# Patient Record
Sex: Female | Born: 1950 | Race: White | Hispanic: No | Marital: Married | Smoking: Former smoker
Health system: Southern US, Community
[De-identification: ages and names within clinical notes are randomized; demographics above are authoritative.]

## PROBLEM LIST (undated history)

## (undated) DIAGNOSIS — K469 Unspecified abdominal hernia without obstruction or gangrene: Secondary | ICD-10-CM

## (undated) DIAGNOSIS — I10 Essential (primary) hypertension: Secondary | ICD-10-CM

## (undated) DIAGNOSIS — E785 Hyperlipidemia, unspecified: Secondary | ICD-10-CM

## (undated) DIAGNOSIS — Z5189 Encounter for other specified aftercare: Secondary | ICD-10-CM

## (undated) DIAGNOSIS — K219 Gastro-esophageal reflux disease without esophagitis: Secondary | ICD-10-CM

## (undated) DIAGNOSIS — Z86010 Personal history of colonic polyps: Secondary | ICD-10-CM

## (undated) DIAGNOSIS — D171 Benign lipomatous neoplasm of skin and subcutaneous tissue of trunk: Secondary | ICD-10-CM

## (undated) DIAGNOSIS — K5732 Diverticulitis of large intestine without perforation or abscess without bleeding: Secondary | ICD-10-CM

## (undated) DIAGNOSIS — G709 Myoneural disorder, unspecified: Secondary | ICD-10-CM

## (undated) HISTORY — DX: Encounter for other specified aftercare: Z51.89

## (undated) HISTORY — DX: Diverticulitis of large intestine without perforation or abscess without bleeding: K57.32

## (undated) HISTORY — DX: Personal history of colonic polyps: Z86.010

## (undated) HISTORY — DX: Unspecified abdominal hernia without obstruction or gangrene: K46.9

## (undated) HISTORY — DX: Benign lipomatous neoplasm of skin and subcutaneous tissue of trunk: D17.1

## (undated) HISTORY — DX: Myoneural disorder, unspecified: G70.9

## (undated) HISTORY — DX: Hyperlipidemia, unspecified: E78.5

## (undated) HISTORY — DX: Gastro-esophageal reflux disease without esophagitis: K21.9

---

## 1974-11-18 HISTORY — PX: OTHER SURGICAL HISTORY: SHX169

## 1983-11-19 HISTORY — PX: OTHER SURGICAL HISTORY: SHX169

## 1986-11-18 HISTORY — PX: BREAST REDUCTION SURGERY: SHX8

## 1998-04-10 ENCOUNTER — Encounter: Admission: RE | Admit: 1998-04-10 | Discharge: 1998-04-10 | Payer: Self-pay | Admitting: Family Medicine

## 1998-07-28 ENCOUNTER — Encounter: Admission: RE | Admit: 1998-07-28 | Discharge: 1998-07-28 | Payer: Self-pay | Admitting: Family Medicine

## 1998-07-28 ENCOUNTER — Other Ambulatory Visit: Admission: RE | Admit: 1998-07-28 | Discharge: 1998-07-28 | Payer: Self-pay | Admitting: Family Medicine

## 1998-08-11 ENCOUNTER — Encounter: Admission: RE | Admit: 1998-08-11 | Discharge: 1998-08-11 | Payer: Self-pay | Admitting: Family Medicine

## 1998-08-18 ENCOUNTER — Encounter: Admission: RE | Admit: 1998-08-18 | Discharge: 1998-08-18 | Payer: Self-pay | Admitting: Family Medicine

## 1998-08-25 ENCOUNTER — Encounter: Admission: RE | Admit: 1998-08-25 | Discharge: 1998-08-25 | Payer: Self-pay | Admitting: Family Medicine

## 1998-09-25 ENCOUNTER — Encounter: Admission: RE | Admit: 1998-09-25 | Discharge: 1998-09-25 | Payer: Self-pay | Admitting: Family Medicine

## 1999-04-30 ENCOUNTER — Encounter: Admission: RE | Admit: 1999-04-30 | Discharge: 1999-04-30 | Payer: Self-pay | Admitting: Family Medicine

## 1999-08-22 ENCOUNTER — Encounter: Admission: RE | Admit: 1999-08-22 | Discharge: 1999-08-22 | Payer: Self-pay | Admitting: Family Medicine

## 2000-05-19 ENCOUNTER — Encounter: Admission: RE | Admit: 2000-05-19 | Discharge: 2000-05-19 | Payer: Self-pay | Admitting: Family Medicine

## 2000-06-09 ENCOUNTER — Encounter: Admission: RE | Admit: 2000-06-09 | Discharge: 2000-06-09 | Payer: Self-pay | Admitting: Family Medicine

## 2001-04-21 ENCOUNTER — Encounter: Admission: RE | Admit: 2001-04-21 | Discharge: 2001-04-21 | Payer: Self-pay | Admitting: Family Medicine

## 2001-10-26 ENCOUNTER — Encounter: Admission: RE | Admit: 2001-10-26 | Discharge: 2001-10-26 | Payer: Self-pay | Admitting: Family Medicine

## 2002-04-16 ENCOUNTER — Ambulatory Visit (HOSPITAL_COMMUNITY): Admission: RE | Admit: 2002-04-16 | Discharge: 2002-04-16 | Payer: Self-pay | Admitting: Family Medicine

## 2002-04-16 ENCOUNTER — Encounter: Admission: RE | Admit: 2002-04-16 | Discharge: 2002-04-16 | Payer: Self-pay | Admitting: Family Medicine

## 2002-05-07 ENCOUNTER — Encounter: Admission: RE | Admit: 2002-05-07 | Discharge: 2002-05-07 | Payer: Self-pay | Admitting: Family Medicine

## 2002-05-07 ENCOUNTER — Encounter (INDEPENDENT_AMBULATORY_CARE_PROVIDER_SITE_OTHER): Payer: Self-pay | Admitting: *Deleted

## 2002-05-20 ENCOUNTER — Encounter: Admission: RE | Admit: 2002-05-20 | Discharge: 2002-05-20 | Payer: Self-pay | Admitting: Family Medicine

## 2002-06-05 ENCOUNTER — Emergency Department (HOSPITAL_COMMUNITY): Admission: EM | Admit: 2002-06-05 | Discharge: 2002-06-05 | Payer: Self-pay | Admitting: Internal Medicine

## 2002-06-21 ENCOUNTER — Encounter: Admission: RE | Admit: 2002-06-21 | Discharge: 2002-06-21 | Payer: Self-pay | Admitting: Family Medicine

## 2002-09-27 ENCOUNTER — Encounter: Admission: RE | Admit: 2002-09-27 | Discharge: 2002-09-27 | Payer: Self-pay | Admitting: Family Medicine

## 2002-10-05 ENCOUNTER — Encounter: Admission: RE | Admit: 2002-10-05 | Discharge: 2002-10-05 | Payer: Self-pay | Admitting: Family Medicine

## 2003-03-04 ENCOUNTER — Encounter: Admission: RE | Admit: 2003-03-04 | Discharge: 2003-03-04 | Payer: Self-pay | Admitting: Family Medicine

## 2003-06-13 ENCOUNTER — Encounter: Admission: RE | Admit: 2003-06-13 | Discharge: 2003-06-13 | Payer: Self-pay | Admitting: Family Medicine

## 2003-06-15 ENCOUNTER — Emergency Department (HOSPITAL_COMMUNITY): Admission: EM | Admit: 2003-06-15 | Discharge: 2003-06-15 | Payer: Self-pay | Admitting: Emergency Medicine

## 2003-07-01 ENCOUNTER — Encounter: Admission: RE | Admit: 2003-07-01 | Discharge: 2003-07-01 | Payer: Self-pay | Admitting: Family Medicine

## 2003-07-12 ENCOUNTER — Encounter: Admission: RE | Admit: 2003-07-12 | Discharge: 2003-07-12 | Payer: Self-pay | Admitting: Family Medicine

## 2003-07-12 ENCOUNTER — Encounter: Payer: Self-pay | Admitting: Family Medicine

## 2004-05-07 ENCOUNTER — Encounter: Admission: RE | Admit: 2004-05-07 | Discharge: 2004-05-07 | Payer: Self-pay | Admitting: Family Medicine

## 2004-08-18 ENCOUNTER — Encounter (INDEPENDENT_AMBULATORY_CARE_PROVIDER_SITE_OTHER): Payer: Self-pay | Admitting: *Deleted

## 2004-08-18 LAB — CONVERTED CEMR LAB

## 2004-08-31 ENCOUNTER — Encounter: Admission: RE | Admit: 2004-08-31 | Discharge: 2004-08-31 | Payer: Self-pay | Admitting: Family Medicine

## 2004-08-31 ENCOUNTER — Ambulatory Visit: Payer: Self-pay | Admitting: Family Medicine

## 2004-08-31 ENCOUNTER — Encounter: Payer: Self-pay | Admitting: Family Medicine

## 2005-10-07 ENCOUNTER — Ambulatory Visit: Payer: Self-pay | Admitting: Family Medicine

## 2007-01-15 DIAGNOSIS — E669 Obesity, unspecified: Secondary | ICD-10-CM

## 2007-01-15 DIAGNOSIS — L2089 Other atopic dermatitis: Secondary | ICD-10-CM

## 2007-01-15 DIAGNOSIS — E049 Nontoxic goiter, unspecified: Secondary | ICD-10-CM | POA: Insufficient documentation

## 2007-01-15 DIAGNOSIS — E7800 Pure hypercholesterolemia, unspecified: Secondary | ICD-10-CM | POA: Insufficient documentation

## 2007-01-15 DIAGNOSIS — M797 Fibromyalgia: Secondary | ICD-10-CM | POA: Insufficient documentation

## 2007-01-15 DIAGNOSIS — J449 Chronic obstructive pulmonary disease, unspecified: Secondary | ICD-10-CM

## 2007-01-15 DIAGNOSIS — I739 Peripheral vascular disease, unspecified: Secondary | ICD-10-CM | POA: Insufficient documentation

## 2007-01-15 DIAGNOSIS — J4489 Other specified chronic obstructive pulmonary disease: Secondary | ICD-10-CM | POA: Insufficient documentation

## 2007-01-15 DIAGNOSIS — I10 Essential (primary) hypertension: Secondary | ICD-10-CM | POA: Insufficient documentation

## 2007-01-16 ENCOUNTER — Encounter (INDEPENDENT_AMBULATORY_CARE_PROVIDER_SITE_OTHER): Payer: Self-pay | Admitting: *Deleted

## 2007-04-02 ENCOUNTER — Telehealth: Payer: Self-pay | Admitting: Family Medicine

## 2008-02-24 ENCOUNTER — Telehealth: Payer: Self-pay | Admitting: Family Medicine

## 2008-04-05 ENCOUNTER — Telehealth: Payer: Self-pay | Admitting: *Deleted

## 2008-04-06 ENCOUNTER — Ambulatory Visit: Payer: Self-pay | Admitting: Family Medicine

## 2008-04-06 ENCOUNTER — Encounter (INDEPENDENT_AMBULATORY_CARE_PROVIDER_SITE_OTHER): Payer: Self-pay | Admitting: Family Medicine

## 2008-04-06 DIAGNOSIS — K219 Gastro-esophageal reflux disease without esophagitis: Secondary | ICD-10-CM | POA: Insufficient documentation

## 2008-04-06 LAB — CONVERTED CEMR LAB
Blood in Urine, dipstick: NEGATIVE
Ketones, urine, test strip: NEGATIVE
Nitrite: NEGATIVE
Protein, U semiquant: 100
Specific Gravity, Urine: 1.015
pH: 7.5

## 2008-04-08 ENCOUNTER — Encounter: Payer: Self-pay | Admitting: Family Medicine

## 2008-04-08 LAB — CONVERTED CEMR LAB
Hep A IgM: NEGATIVE
Hep B C IgM: NEGATIVE

## 2008-04-12 LAB — CONVERTED CEMR LAB
Albumin: 4 g/dL (ref 3.5–5.2)
BUN: 13 mg/dL (ref 6–23)
Calcium: 10 mg/dL (ref 8.4–10.5)
Glucose, Bld: 117 mg/dL — ABNORMAL HIGH (ref 70–99)
Lipase: 38 units/L (ref 0–75)
Sodium: 139 meq/L (ref 135–145)
Total Bilirubin: 1.3 mg/dL — ABNORMAL HIGH (ref 0.3–1.2)

## 2008-04-13 ENCOUNTER — Telehealth: Payer: Self-pay | Admitting: Family Medicine

## 2008-08-25 ENCOUNTER — Encounter: Payer: Self-pay | Admitting: Family Medicine

## 2008-11-18 HISTORY — PX: CHOLECYSTECTOMY: SHX55

## 2009-01-23 ENCOUNTER — Encounter: Payer: Self-pay | Admitting: Family Medicine

## 2009-03-22 ENCOUNTER — Ambulatory Visit: Payer: Self-pay | Admitting: Family Medicine

## 2009-03-22 ENCOUNTER — Telehealth: Payer: Self-pay | Admitting: *Deleted

## 2009-03-22 ENCOUNTER — Encounter: Payer: Self-pay | Admitting: Family Medicine

## 2009-03-22 DIAGNOSIS — Z9089 Acquired absence of other organs: Secondary | ICD-10-CM | POA: Insufficient documentation

## 2009-03-22 LAB — CONVERTED CEMR LAB: Pap Smear: NORMAL

## 2009-03-23 ENCOUNTER — Ambulatory Visit (HOSPITAL_COMMUNITY): Admission: RE | Admit: 2009-03-23 | Discharge: 2009-03-23 | Payer: Self-pay | Admitting: Family Medicine

## 2009-03-24 ENCOUNTER — Telehealth (INDEPENDENT_AMBULATORY_CARE_PROVIDER_SITE_OTHER): Payer: Self-pay | Admitting: *Deleted

## 2009-03-24 ENCOUNTER — Encounter: Payer: Self-pay | Admitting: *Deleted

## 2009-03-24 ENCOUNTER — Encounter: Payer: Self-pay | Admitting: Family Medicine

## 2009-03-24 LAB — CONVERTED CEMR LAB
AST: 45 units/L — ABNORMAL HIGH (ref 0–37)
Albumin: 4 g/dL (ref 3.5–5.2)
Alkaline Phosphatase: 131 units/L — ABNORMAL HIGH (ref 39–117)
HDL: 44 mg/dL (ref >39)
LDL Cholesterol: 134 mg/dL — ABNORMAL HIGH (ref 0–99)
MCHC: 33.8 g/dL (ref 30.0–36.0)
Potassium: 4.2 meq/L (ref 3.5–5.3)
RDW: 14.6 % (ref 11.5–15.5)
Sodium: 142 meq/L (ref 135–145)
TSH: 2.084 microintl units/mL (ref 0.350–4.500)
Total Protein: 7.2 g/dL (ref 6.0–8.3)

## 2009-04-03 ENCOUNTER — Telehealth (INDEPENDENT_AMBULATORY_CARE_PROVIDER_SITE_OTHER): Payer: Self-pay | Admitting: *Deleted

## 2009-04-04 ENCOUNTER — Telehealth: Payer: Self-pay | Admitting: Family Medicine

## 2009-04-19 ENCOUNTER — Telehealth: Payer: Self-pay | Admitting: Family Medicine

## 2009-07-10 ENCOUNTER — Telehealth: Payer: Self-pay | Admitting: Family Medicine

## 2009-07-11 ENCOUNTER — Ambulatory Visit: Payer: Self-pay | Admitting: Internal Medicine

## 2009-07-11 ENCOUNTER — Inpatient Hospital Stay (HOSPITAL_COMMUNITY): Admission: EM | Admit: 2009-07-11 | Discharge: 2009-07-16 | Payer: Self-pay | Admitting: Emergency Medicine

## 2009-07-12 ENCOUNTER — Encounter (INDEPENDENT_AMBULATORY_CARE_PROVIDER_SITE_OTHER): Payer: Self-pay | Admitting: General Surgery

## 2009-07-14 ENCOUNTER — Encounter: Payer: Self-pay | Admitting: Internal Medicine

## 2009-07-20 ENCOUNTER — Telehealth: Payer: Self-pay | Admitting: Family Medicine

## 2009-07-20 ENCOUNTER — Encounter: Payer: Self-pay | Admitting: Family Medicine

## 2009-07-26 ENCOUNTER — Encounter: Payer: Self-pay | Admitting: Internal Medicine

## 2009-07-26 ENCOUNTER — Encounter: Payer: Self-pay | Admitting: Family Medicine

## 2009-08-16 ENCOUNTER — Encounter: Payer: Self-pay | Admitting: Family Medicine

## 2009-09-04 ENCOUNTER — Telehealth: Payer: Self-pay | Admitting: Family Medicine

## 2010-02-07 ENCOUNTER — Ambulatory Visit: Payer: Self-pay | Admitting: Family Medicine

## 2010-02-07 LAB — CONVERTED CEMR LAB
Alkaline Phosphatase: 76 units/L (ref 39–117)
Creatinine, Ser: 0.6 mg/dL (ref 0.40–1.20)
Glucose, Bld: 88 mg/dL (ref 70–99)
LDL Cholesterol: 99 mg/dL (ref 0–99)
Sodium: 140 meq/L (ref 135–145)
Total Bilirubin: 0.5 mg/dL (ref 0.3–1.2)
Total CHOL/HDL Ratio: 3.6
Total Protein: 7.4 g/dL (ref 6.0–8.3)
Triglycerides: 137 mg/dL (ref <150)
VLDL: 27 mg/dL (ref 0–40)

## 2010-02-14 ENCOUNTER — Telehealth: Payer: Self-pay | Admitting: Family Medicine

## 2010-04-14 IMAGING — US US ABDOMEN COMPLETE
1 series · 13 of 25 positions shown · non-contrast
Comparison: None

CLINICAL DATA: Abdominal and back pain.  Hypertension.

COMPLETE ABDOMINAL ULTRASOUND

[Series 1: us abdomen complete · 0.35mm/px · 13 of 78 slices shown]
[im 1/78]
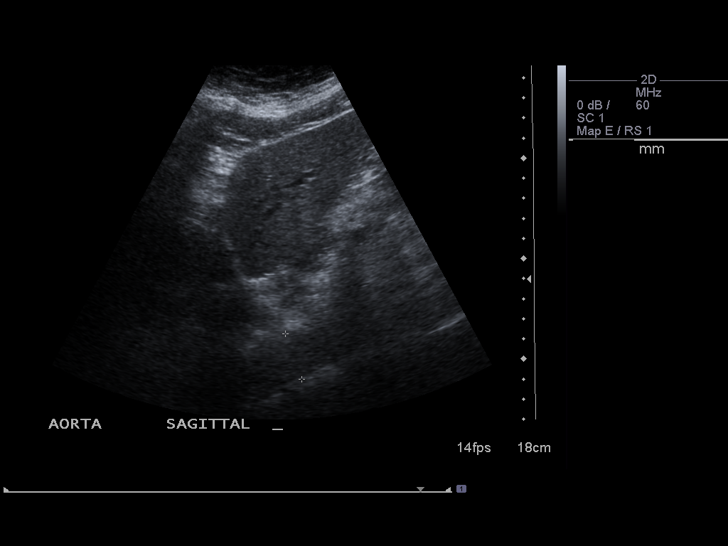
[im 7/78]
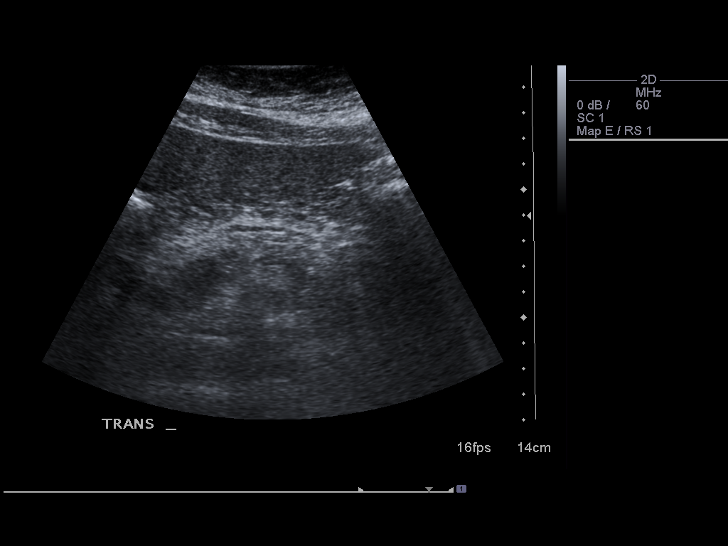
[im 13/78]
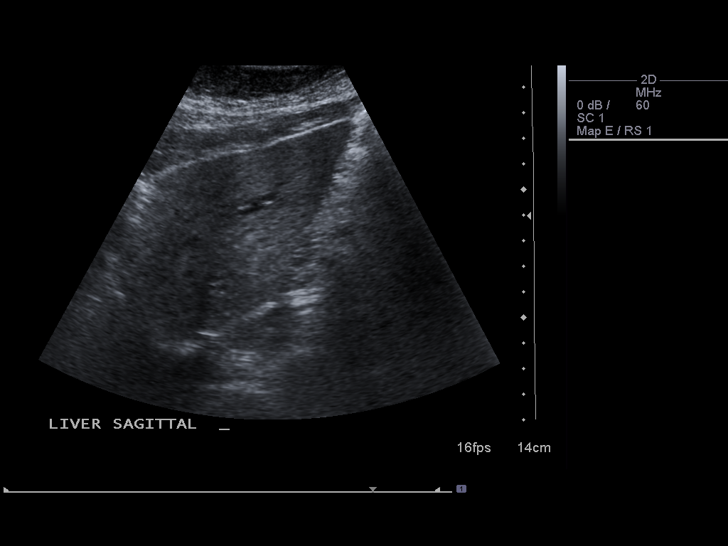
[im 20/78]
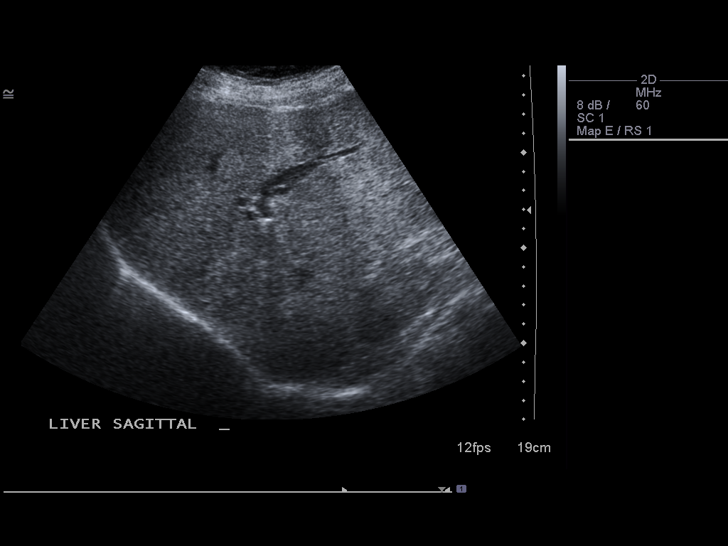
[im 26/78]
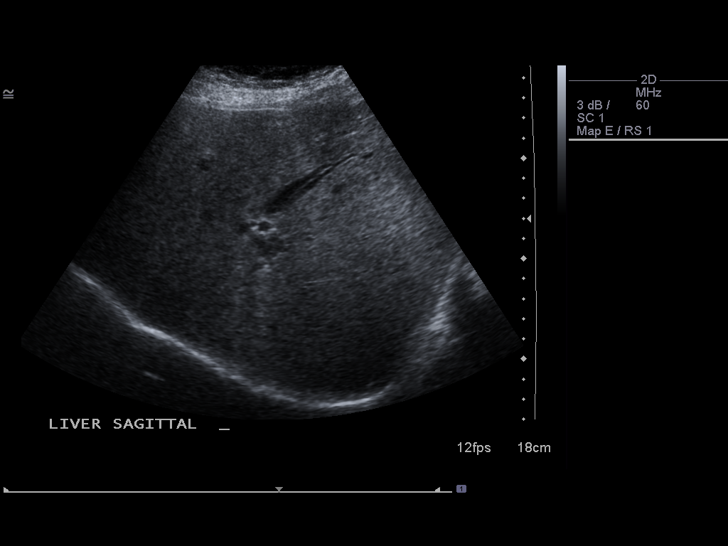
[im 33/78]
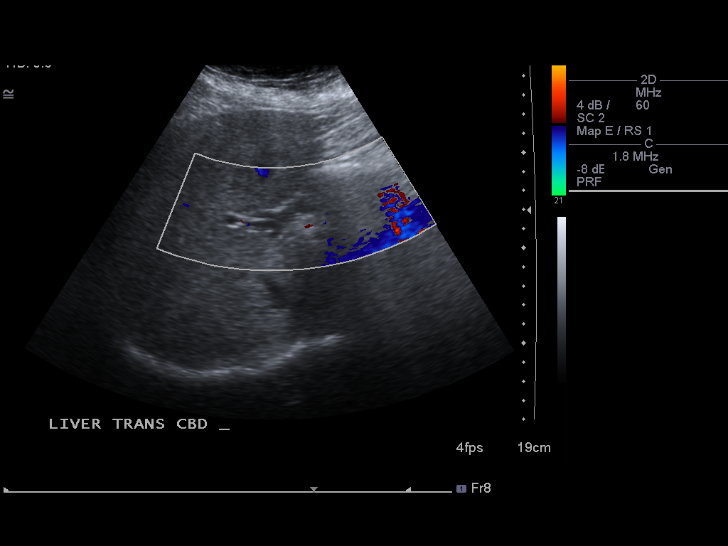
[im 39/78]
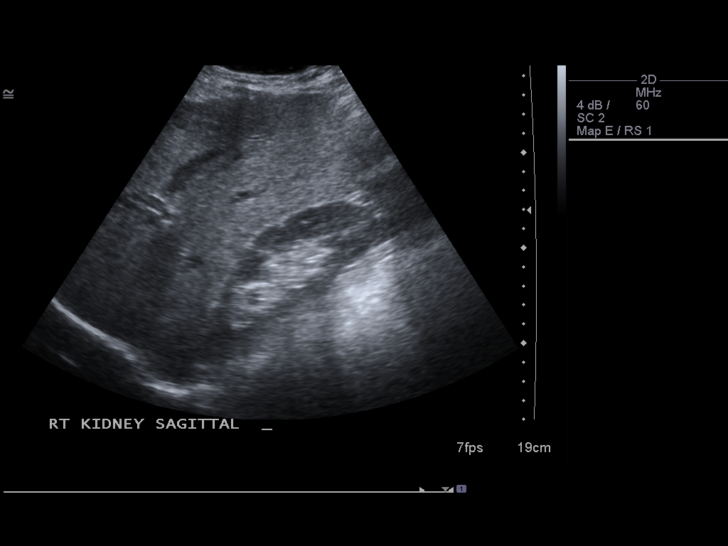
[im 45/78]
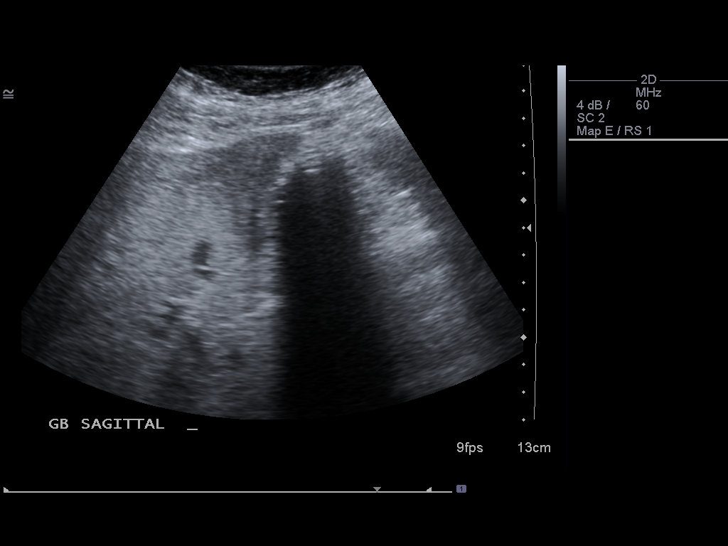
[im 52/78]
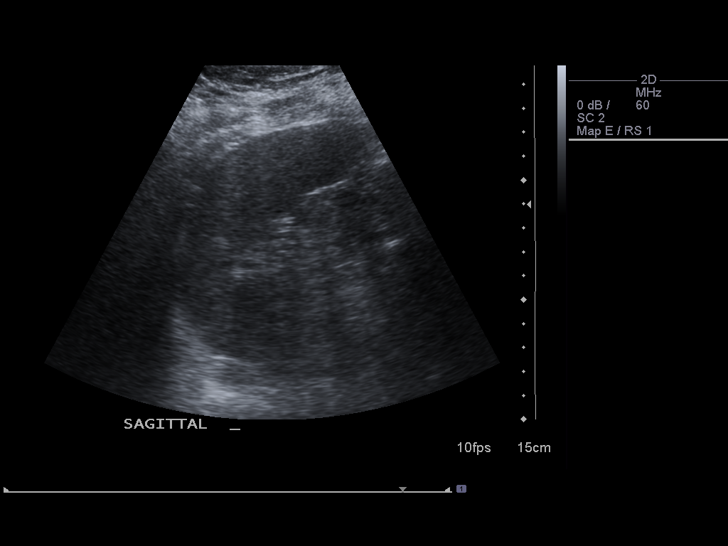
[im 58/78]
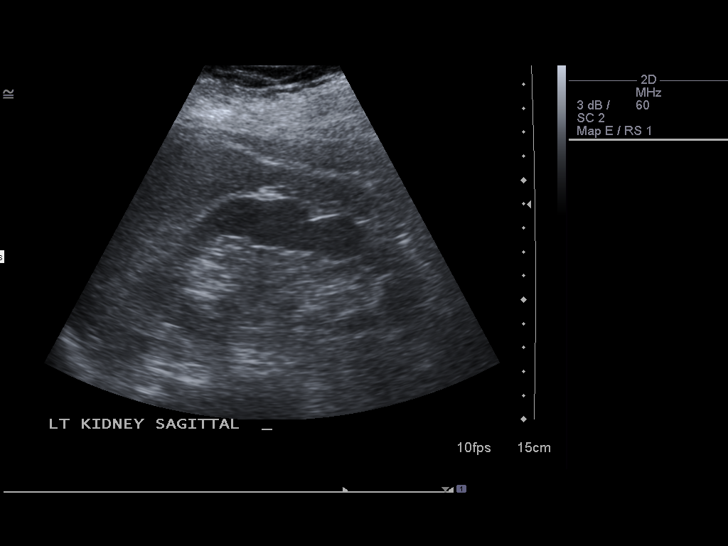
[im 65/78]
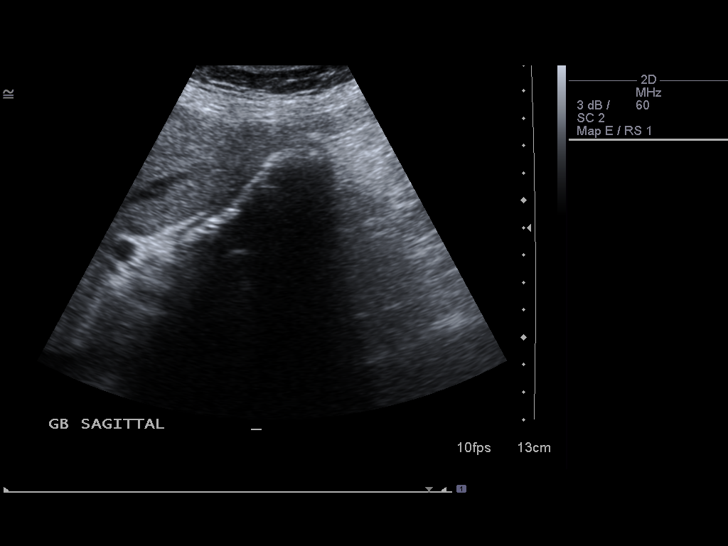
[im 71/78]
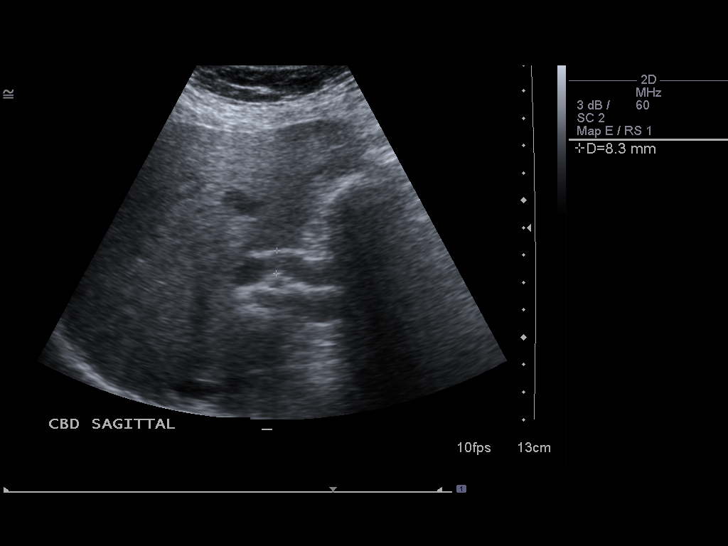
[im 78/78]
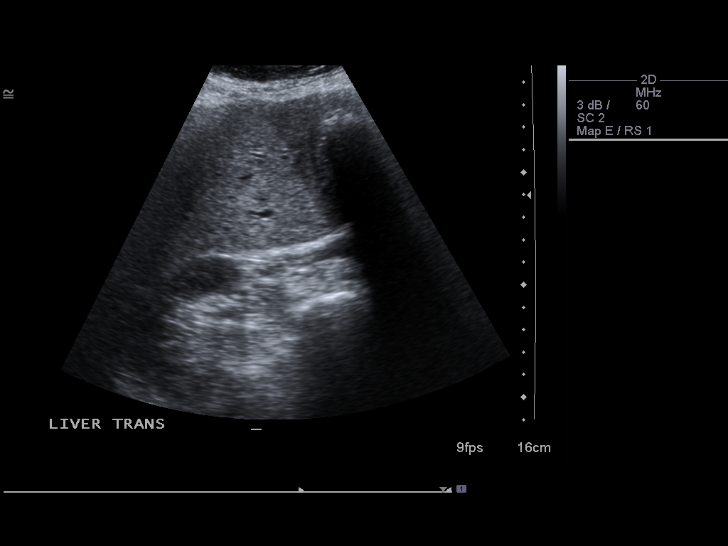

[13 of 25 positions shown; findings below may reference images not displayed]

FINDINGS: Gallbladder:  Gallbladder is filled with shadowing gallstones
(positive OSEI OWUSU sign).  Sonographic Murphy's sign currently evoked.
No discernible gallbladder wall for accurate measurement
visualized.

Common bile duct:  Slight dilatation of intrahepatic and
extrahepatic bile ducts noted with porta hepatis segment common
bile duct measuring 8 mm.

Liver:  Sonographically normal.

IVC:  Sonographically normal.

Pancreas:  Visualized portions sonographically normal with
pancreatic duct upper limits of normal in size at 2 mm.

Spleen:  Sonographically normal measuring 9.1 cm long.

Right Kidney:  Sonographically normal measuring 12.0 cm long.

Left Kidney:  Sonographically normal measuring 11.8 cm long.

Abdominal aorta:  Sonographically normal with maximum proximal
diameter 2.4 cm.

Other Findings:  No free fluid visualized.
IMPRESSION: 1.  Gallbladder filled with gallstones with sonographic Murphy's
sign evoked suggesting acute cholecystitis.  Need clinical
correlation.
2.  Dilated intrahepatic and extrahepatic bile ducts with exact
level and etiology not determined on current study.  Since no
sonographic pancreatic head mass, suspect sonographically occult
choledocholithiasis etiology.
3.  Otherwise, negative.

[REDACTED] voice mail telephoned to [DATE] a.m.
03/23/2009.

## 2010-08-03 IMAGING — RF DG CHOLANGIOGRAM OPERATIVE
1 series · 5 of 5 positions shown · non-contrast
Comparison: None

CLINICAL DATA: Biliary colic

INTRAOPERATIVE CHOLANGIOGRAM
TECHNIQUE: Routine

[Series 1: run · 2 acquisitions, 5 frames shown]
[im 1/2]
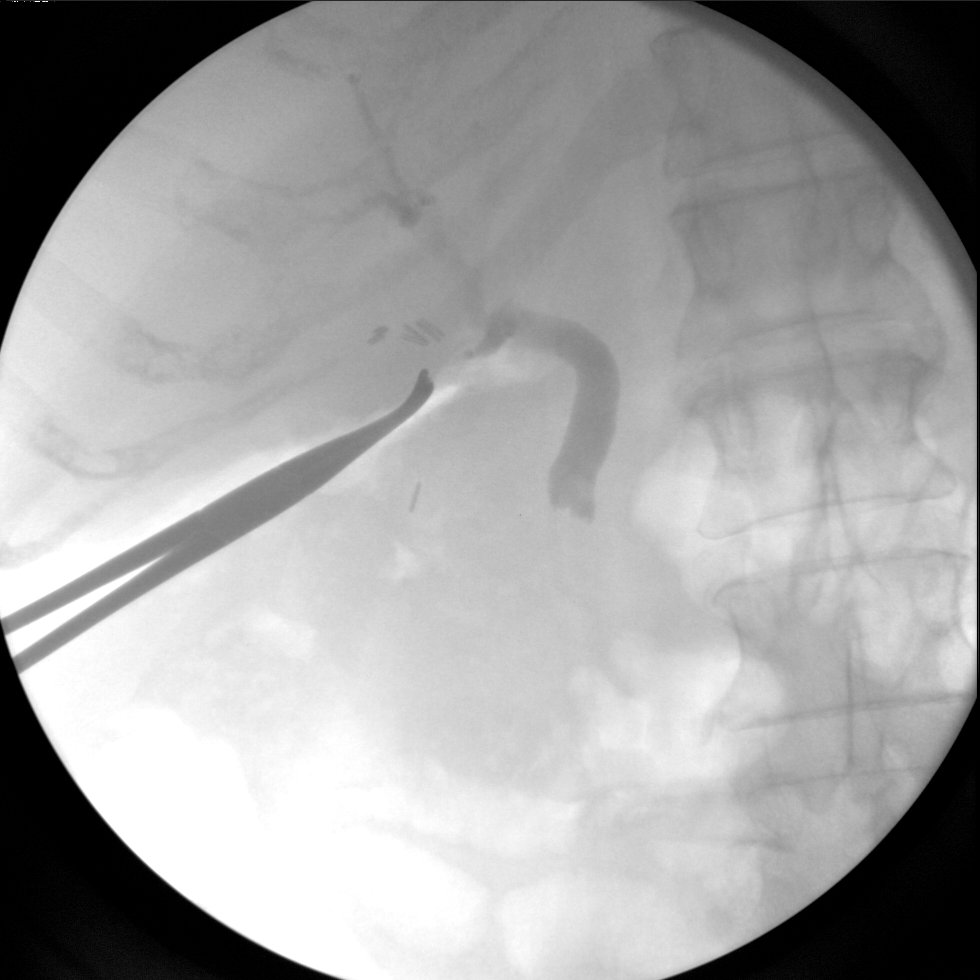
[im 1/2]
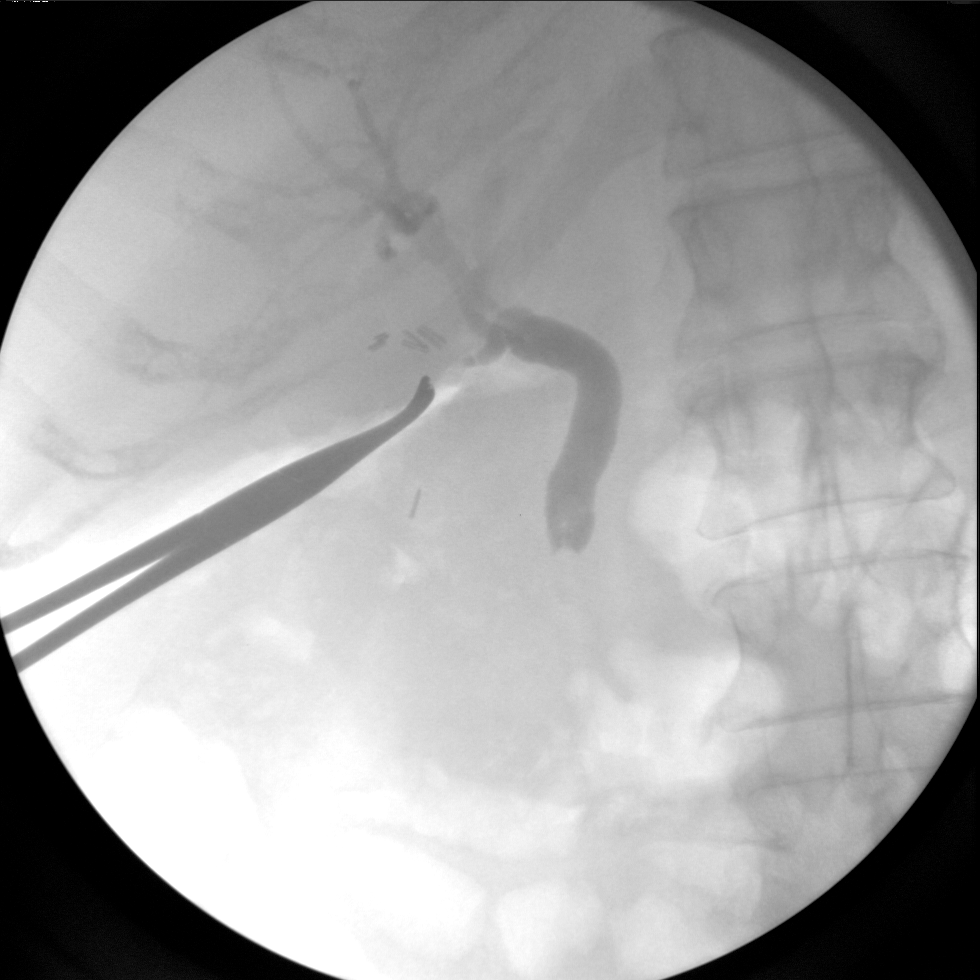
[im 1/2]
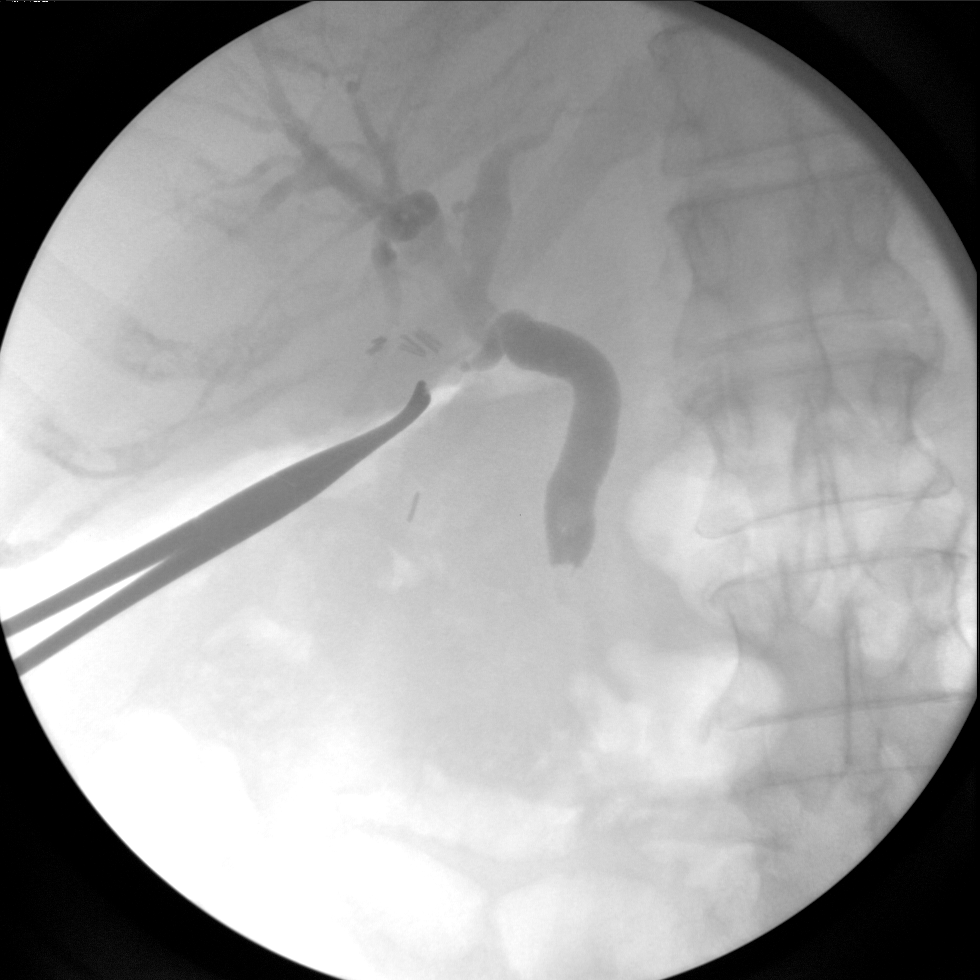
[im 1/2]
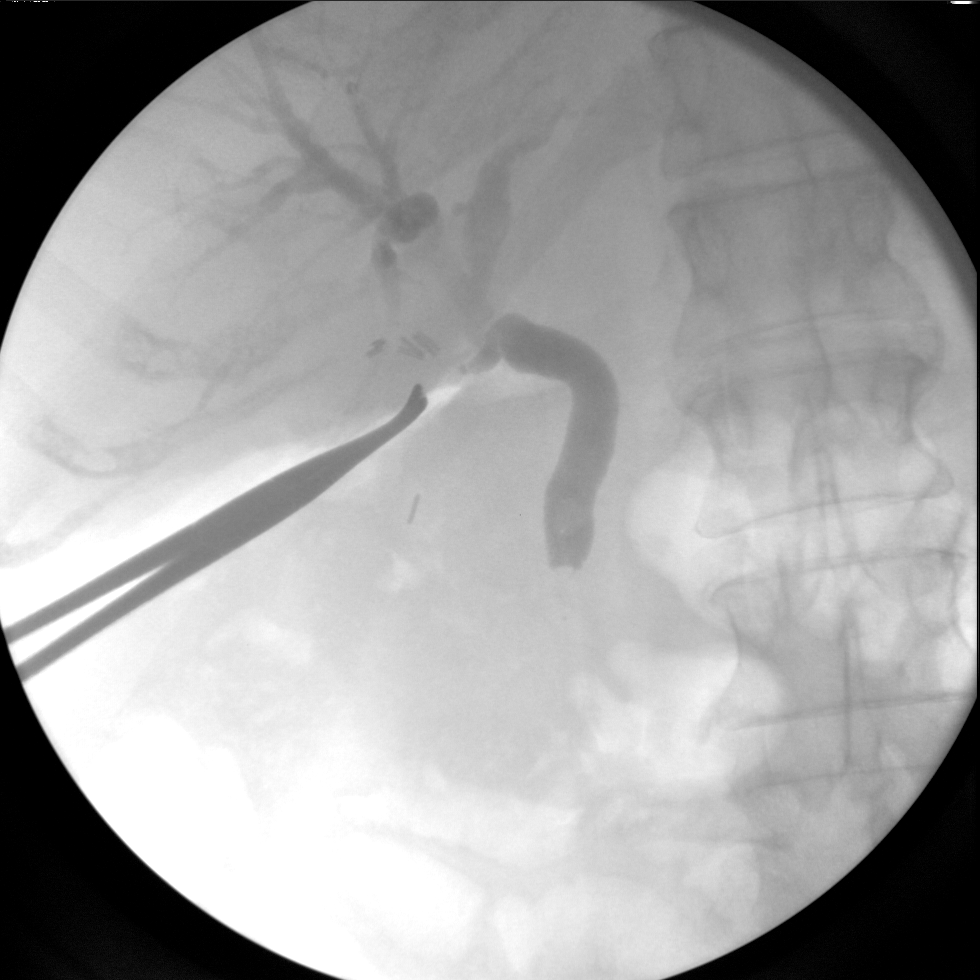
[im 2/2]
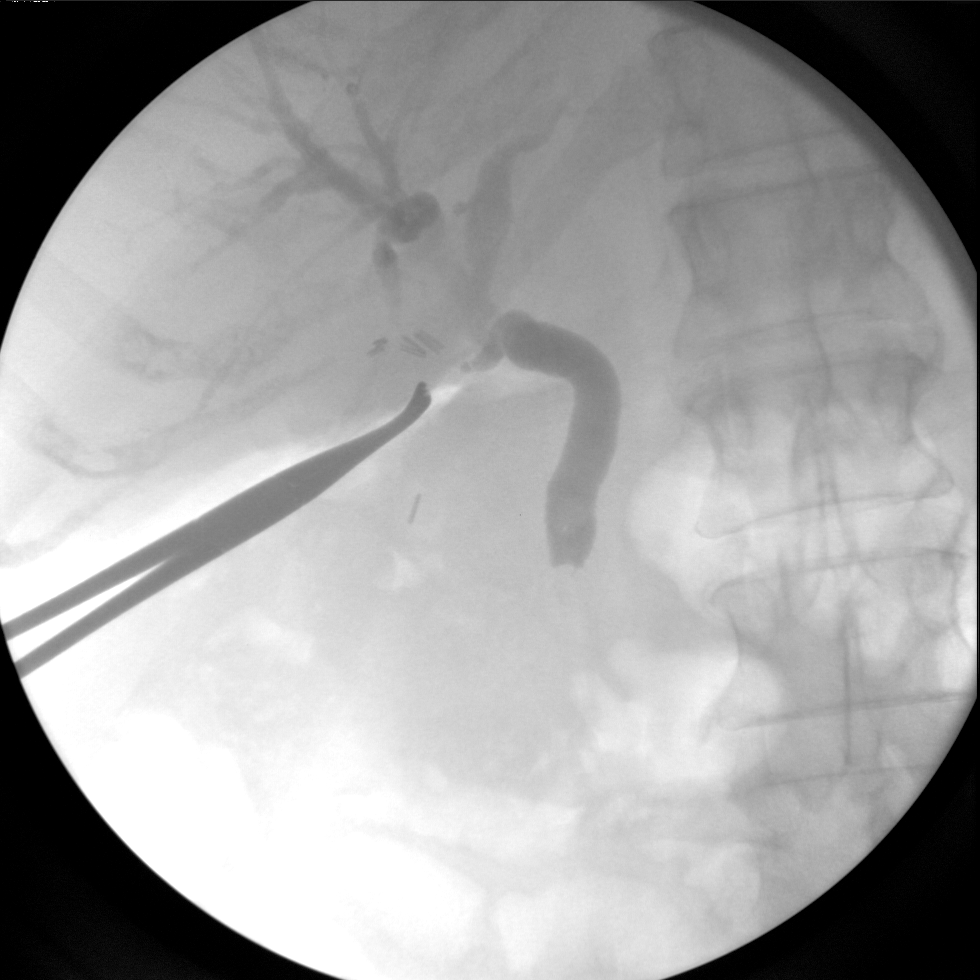

[5 of 5 positions shown; findings below may reference images not displayed]

FINDINGS: There is abrupt obstruction of the mid to distal common
bile duct by a meniscus configuration indicative of an obstructing
stone.  Above this level, there is a second negative defect
indicative of second stone.

There is moderate dilatation of the intrahepatic bile ducts.
IMPRESSION: Stones in the common bile duct with complete obstruction.

## 2010-08-05 IMAGING — RF DG ERCP WO/W SPHINCTEROTOMY
1 series · 8 of 8 positions shown · non-contrast
Comparison: Intraoperative cholangiogram 07/12/2009.

CLINICAL DATA: Cholecystectomy 2 days ago.  Retained bile duct
stones.

ERCP 07/14/2009:
TECHNIQUE: Multiple spot images obtained with the fluoroscopic
device and submitted for interpretation post-procedure.  ERCP was
performed by Dr. Jim.

[Series 1: run · 8 of 8 slices shown]
[im 1/8]
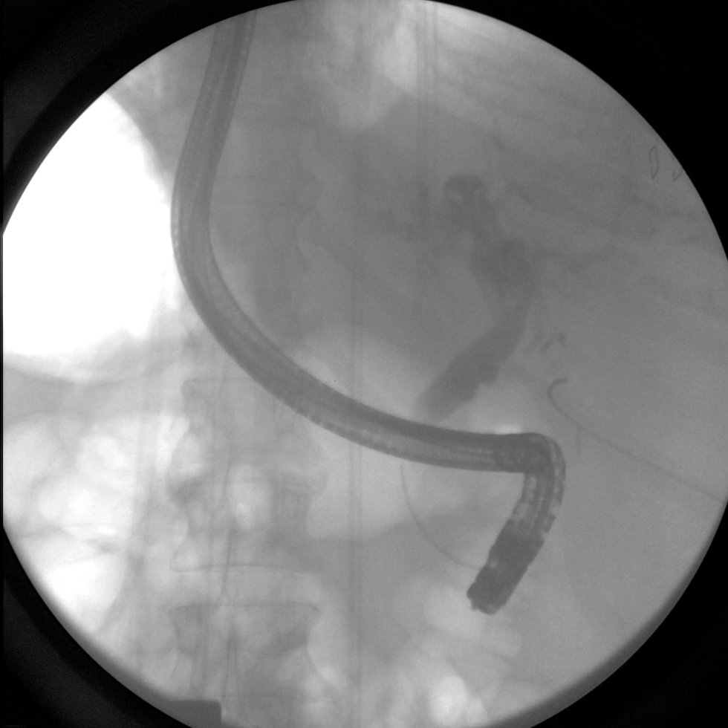
[im 2/8]
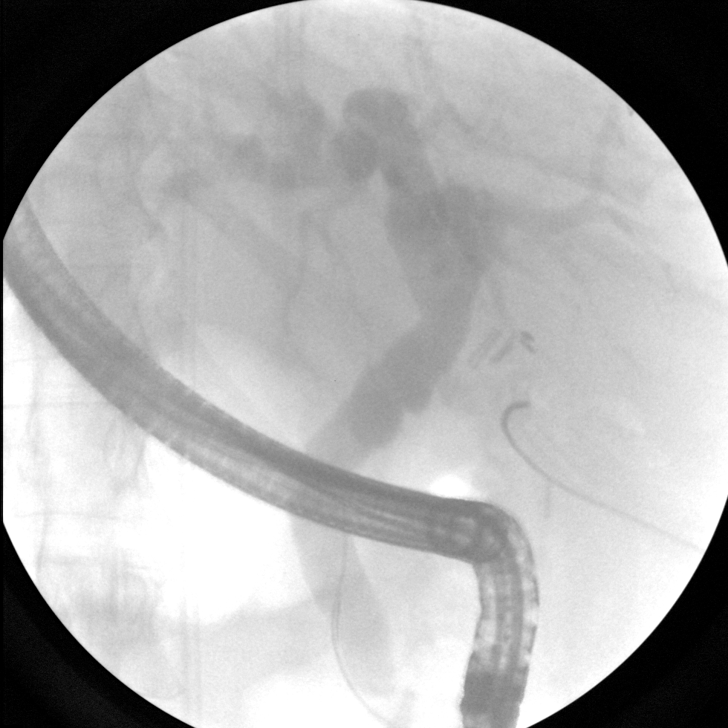
[im 3/8]
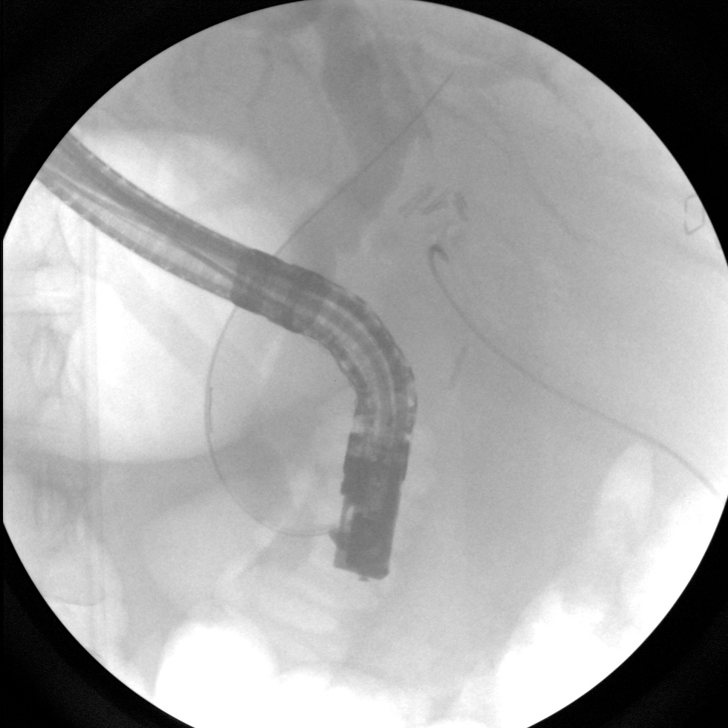
[im 4/8]
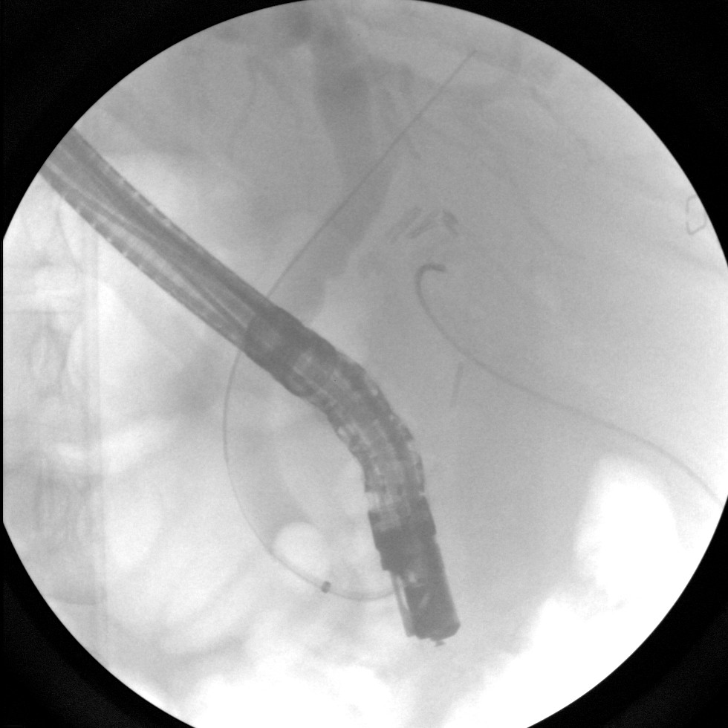
[im 5/8]
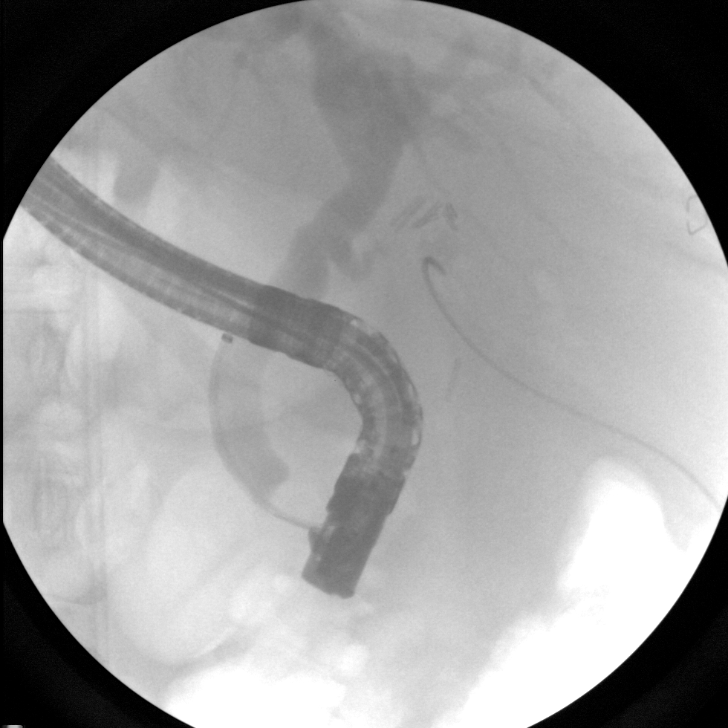
[im 6/8]
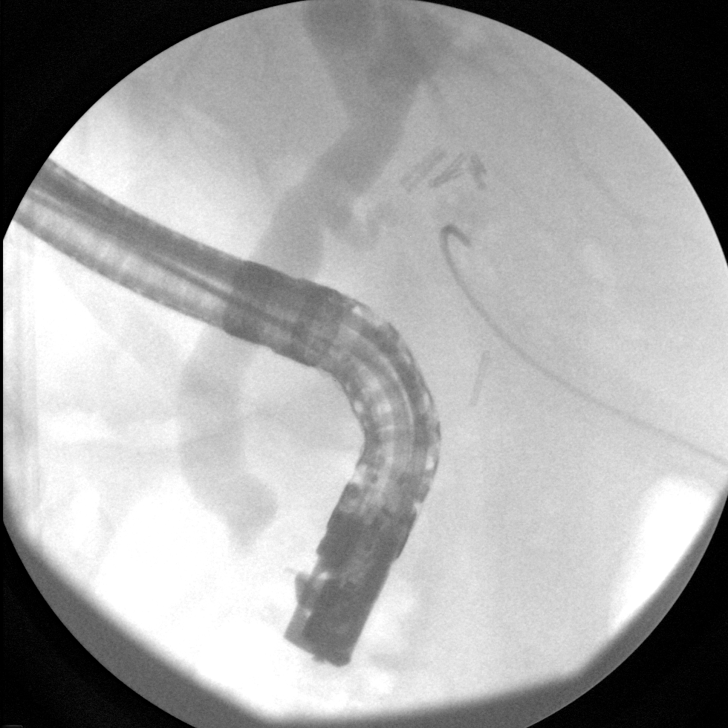
[im 7/8]
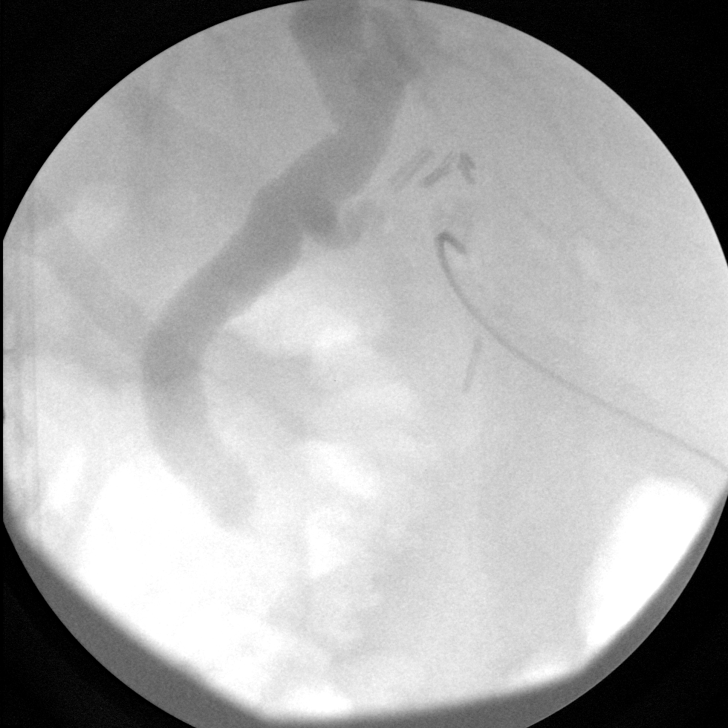
[im 8/8]
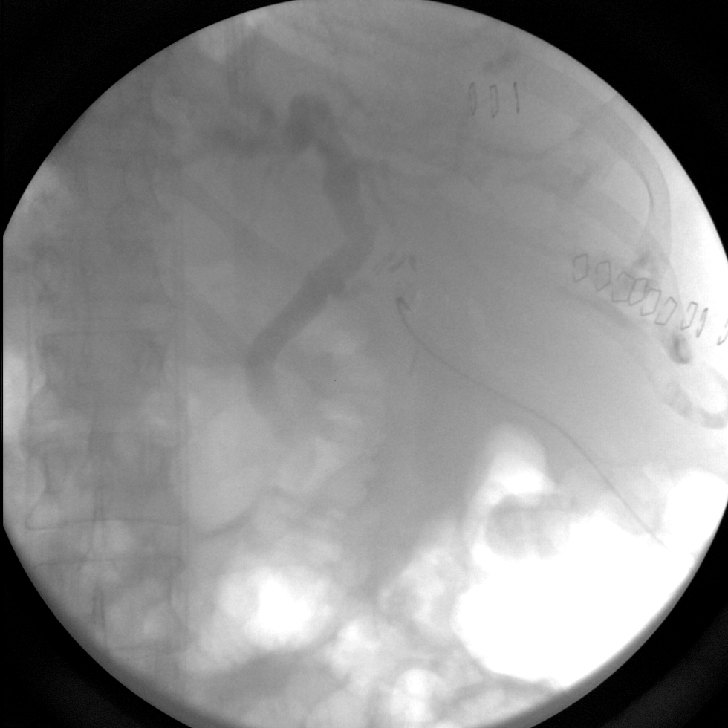

[8 of 8 positions shown; findings below may reference images not displayed]

FINDINGS: The filling defect identified on the intraoperative
cholangiogram was not identified on the submitted images.  Pain
sphincterotomy was performed and the duct was swept with the
balloon.  The completion images showed no residual filling defects.
There is no extravasation from the cystic duct remnant.  A surgical
drain is in place. The technologist documented 2.34 minutes of
fluoroscopy time.
IMPRESSION: ERCP and sphincterotomy with removal of bile duct stones by Dr.
Jim.

These images were submitted for radiologic interpretation only.
Please see the procedural report for the amount of contrast and the
fluoroscopy time utilized.

## 2010-12-10 ENCOUNTER — Encounter: Payer: Self-pay | Admitting: Family Medicine

## 2010-12-18 NOTE — Progress Notes (Signed)
Summary: Lab Res  Phone Note Call from Patient Call back at Work Phone [phone]   Caller: Patient Summary of Call: Pt checking on lab work from last week. Initial call taken by: Clydell Hakim,  February 14, 2010 9:51 AM  Follow-up for Phone Call        will forward to MD. Follow-up by: Theresia Lo RN,  February 14, 2010 1:34 PM  Additional Follow-up for Phone Call Additional follow up Details #1::        Called and discussed. Additional Follow-up by: Doralee Albino MD,  February 14, 2010 1:47 PM
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
## 2011-02-05 ENCOUNTER — Other Ambulatory Visit: Payer: Self-pay | Admitting: Family Medicine

## 2011-02-05 NOTE — Telephone Encounter (Signed)
Refill request

## 2011-02-23 LAB — DIFFERENTIAL
Eosinophils Absolute: 0.3 10*3/uL (ref 0.0–0.7)
Eosinophils Relative: 4 % (ref 0–5)
Lymphocytes Relative: 28 % (ref 12–46)
Lymphs Abs: 2.5 10*3/uL (ref 0.7–4.0)
Monocytes Relative: 7 % (ref 3–12)

## 2011-02-23 LAB — COMPREHENSIVE METABOLIC PANEL
ALT: 124 U/L — ABNORMAL HIGH (ref 0–35)
ALT: 253 U/L — ABNORMAL HIGH (ref 0–35)
ALT: 257 U/L — ABNORMAL HIGH (ref 0–35)
AST: 102 U/L — ABNORMAL HIGH (ref 0–37)
AST: 203 U/L — ABNORMAL HIGH (ref 0–37)
AST: 249 U/L — ABNORMAL HIGH (ref 0–37)
AST: 46 U/L — ABNORMAL HIGH (ref 0–37)
AST: 89 U/L — ABNORMAL HIGH (ref 0–37)
Albumin: 2.6 g/dL — ABNORMAL LOW (ref 3.5–5.2)
Albumin: 3.4 g/dL — ABNORMAL LOW (ref 3.5–5.2)
Albumin: 3.8 g/dL (ref 3.5–5.2)
Alkaline Phosphatase: 324 U/L — ABNORMAL HIGH (ref 39–117)
Alkaline Phosphatase: 409 U/L — ABNORMAL HIGH (ref 39–117)
BUN: 4 mg/dL — ABNORMAL LOW (ref 6–23)
BUN: 9 mg/dL (ref 6–23)
CO2: 27 mEq/L (ref 19–32)
CO2: 31 mEq/L (ref 19–32)
Calcium: 10.3 mg/dL (ref 8.4–10.5)
Calcium: 8.8 mg/dL (ref 8.4–10.5)
Calcium: 8.9 mg/dL (ref 8.4–10.5)
Chloride: 103 mEq/L (ref 96–112)
Chloride: 96 mEq/L (ref 96–112)
Chloride: 97 mEq/L (ref 96–112)
Creatinine, Ser: 0.6 mg/dL (ref 0.4–1.2)
Creatinine, Ser: 0.61 mg/dL (ref 0.4–1.2)
Creatinine, Ser: 0.62 mg/dL (ref 0.4–1.2)
Creatinine, Ser: 0.71 mg/dL (ref 0.4–1.2)
GFR calc Af Amer: >60 mL/min (ref >60)
GFR calc Af Amer: >60 mL/min (ref >60)
GFR calc Af Amer: >60 mL/min (ref >60)
GFR calc Af Amer: >60 mL/min (ref >60)
GFR calc non Af Amer: >60 mL/min (ref >60)
GFR calc non Af Amer: >60 mL/min (ref >60)
Glucose, Bld: 111 mg/dL — ABNORMAL HIGH (ref 70–99)
Glucose, Bld: 124 mg/dL — ABNORMAL HIGH (ref 70–99)
Potassium: 3.9 mEq/L (ref 3.5–5.1)
Potassium: 3.9 mEq/L (ref 3.5–5.1)
Sodium: 135 mEq/L (ref 135–145)
Sodium: 138 mEq/L (ref 135–145)
Total Bilirubin: 1.7 mg/dL — ABNORMAL HIGH (ref 0.3–1.2)
Total Bilirubin: 1.9 mg/dL — ABNORMAL HIGH (ref 0.3–1.2)
Total Bilirubin: 5.4 mg/dL — ABNORMAL HIGH (ref 0.3–1.2)
Total Protein: 6.2 g/dL (ref 6.0–8.3)
Total Protein: 6.3 g/dL (ref 6.0–8.3)
Total Protein: 7.6 g/dL (ref 6.0–8.3)

## 2011-02-23 LAB — HEPATIC FUNCTION PANEL
Albumin: 2.4 g/dL — ABNORMAL LOW (ref 3.5–5.2)
Bilirubin, Direct: 0.5 mg/dL — ABNORMAL HIGH (ref 0.0–0.3)
Total Bilirubin: 1.5 mg/dL — ABNORMAL HIGH (ref 0.3–1.2)

## 2011-02-23 LAB — CBC
HCT: 36.5 % (ref 36.0–46.0)
Hemoglobin: 12 g/dL (ref 12.0–15.0)
Hemoglobin: 13.6 g/dL (ref 12.0–15.0)
MCHC: 33.9 g/dL (ref 30.0–36.0)
MCHC: 34 g/dL (ref 30.0–36.0)
MCHC: 34.1 g/dL (ref 30.0–36.0)
MCHC: 35.2 g/dL (ref 30.0–36.0)
MCV: 86.5 fL (ref 78.0–100.0)
MCV: 87 fL (ref 78.0–100.0)
Platelets: 140 10*3/uL — ABNORMAL LOW (ref 150–400)
Platelets: 237 10*3/uL (ref 150–400)
RBC: 4.09 MIL/uL (ref 3.87–5.11)
RBC: 4.5 MIL/uL (ref 3.87–5.11)
RBC: 5.21 MIL/uL — ABNORMAL HIGH (ref 3.87–5.11)
RDW: 14.5 % (ref 11.5–15.5)
RDW: 14.7 % (ref 11.5–15.5)
RDW: 14.9 % (ref 11.5–15.5)
WBC: 7.5 10*3/uL (ref 4.0–10.5)
WBC: 8.1 10*3/uL (ref 4.0–10.5)

## 2011-02-23 LAB — LIPASE, BLOOD: Lipase: 15 U/L (ref 11–59)

## 2011-04-02 NOTE — Op Note (Signed)
Barbara Miles, Barbara Miles                 ACCOUNT Barbara Miles.:  [id number]   MEDICAL RECORD Barbara Miles.:  [id number]          PATIENT TYPE:  INP   LOCATION:  5120                         FACILITY:  [facility]   PHYSICIAN:  Barbara Dare. Janee Miles, M.D.DATE OF BIRTH:  11/06/1951   DATE OF PROCEDURE:  07/12/2009  DATE OF DISCHARGE:                               OPERATIVE REPORT   PREOPERATIVE DIAGNOSIS:  Symptomatic cholelithiasis.   POSTOPERATIVE DIAGNOSES:  1. Chronic cholecystitis.  2. Choledocholithiasis.   PROCEDURES:  Laparoscopic converted to open cholecystectomy with  cholangiogram.   SURGEON:  Barbara Dare. Janee Morn, MD   ASSISTANT:  1. Barbara Pancoast. Zachery Dakins, MD  2. Barbara El, PA-C   ANESTHESIA:  General endotracheal.   ESTIMATED BLOOD LOSS:  150 mL.   HISTORY OF PRESENT ILLNESS:  [name] is a 60 year old female with at  least several-month history of symptomatic cholelithiasis.  She was  admitted yesterday.  She had mild elevation of transaminases and  alkaline phosphatase, but a normal bilirubin.  She is brought for  cholecystectomy.  She has been receiving intravenous antibiotics.   PROCEDURE IN DETAIL:  Informed consent was obtained.  The patient was  identified in the preop holding area.  Again, she was on IV antibiotic  regimen.  She was brought to the operating room.  General endotracheal  anesthesia was administered by the anesthesia staff.  Her abdomen was  prepped and draped in a sterile fashion.  We performed a time-out  procedure.  Next, the area superior to her umbilicus was infiltrated  0.25% Marcaine with epinephrine.  A supraumbilical incision was made.  Subcutaneous tissues were dissected down revealing anterior fascia.  This was divided sharply and the peritoneal cavity was entered under  direct vision without difficulty.  A 0 Vicryl purse-string suture was  placed around the fascial opening.  The Hasson trocar was inserted into  the abdomen.  The abdomen was insufflated with  carbon dioxide in a  standard fashion under direct vision.  An 11-mm epigastric and two 5-mm  lateral ports were placed.  A 0.25% Marcaine with epinephrine was used  at all of those port sites as well.  Laparoscopic exploration revealed a  severely inflamed gallbladder with densely adherent omentum to the dome,  the gallbladder was not visible, whatsoever.  At this time, some gradual  gentle dissection was used to dissect this omentum down off the  gallbladder and visualize the dome.  This allowed [place] to at least grasp  the dome, however, the woody inflammation precluded significant further  dissection.  Due to the proximity of the patient's colon, it was  determined it would not be safe to continue proceeding laparoscopically.  So, we converted to open procedure.  Right subcostal incision was made.  Subcutaneous tissues were dissected down revealing the anterior fascia.  This was divided sharply.  The rectus muscle was then divided using  Bovie cautery achieving excellent hemostasis.  Next, posterior fascia  was divided and the peritoneum was entered under direct vision without  difficulty.  The fascial layers were opened to length of the skin  incision.  The gallbladder was then visualized.  We grasped the dome.  It was partially necrotic in that area and the top was opened.  Multiple  stones were extracted to facilitate easier grasping of the gallbladder.  Next, we very gently dissected the colon free from the lateral portion  that was done very carefully and there appeared to be Barbara Miles injuries to the  colon.  Next, the gallbladder was taken off the liver bed using Bovie  cautery, extending down towards the infundibulum area.  Then, we stopped  and then carefully dissected and mobilized the anterior portion of the  infundibulum using Kittners and very careful gradual dissection.  There  was significant amount of chronic inflammation and this continued.  We  did seem to find an anterior  branch and posterior branch of the cystic  artery, both of these were clipped twice proximally and divided.  The  gallbladder was continued to be dissected down and we did identify the  cystic duct.  This was circumferentially dissected.  The gallbladder was  quite friable and likely had some patchy necrosis, so it was debrided  away, leaving a long portion of the infundibulum.  Next, we inserted the  Reddick cholangiogram catheter and placed a clamp to hold it in place.  We got an intraoperative cholangiogram which demonstrated a short length  of cystic duct and a meniscus sign and common bile duct filling defects  consistent with common bile duct stone.  The contrast did not go into  the duodenum.  The cholangiogram catheter was removed.  The cystic duct  was further dissected down slightly and then it was double ligated with  0 silk suture achieving excellent closure.  The remainder of the  infundibulum was debrided off and another likely small branch of the  cystic artery was also ligated with 0 silk.  The liver bed was copiously  irrigated and meticulous hemostasis was obtained using Bovie cautery.  The cystic duct closure appeared excellent.  There was Barbara Miles further  bleeding.  We did place a piece of Surgicel in the liver bed.  It was  cut in half.  One portion was placed down close to the cystic duct and  other was laid nicely along the liver bed.  A 19-French Blake drain was  inserted in that area and sutured in place with 2-0 nylon suture.  The  abdomen was copiously irrigated.  The irrigation fluid returned clear.  We rechecked the cystic duct and liver bed and everything was dry.  There was Barbara Miles leakage of bile or blood.  We re-inspected the colon and it  appeared intact without injury.  The remainder of the irrigation fluid  was evacuated.  Laparotomy sponge count was noted to be correct.  The  posterior fascial layer was then closed with running #1 PDS suture and  the anterior  fascial layer was closed with running #1 PDS suture.  Subcutaneous tissues were irrigated and then all wounds were closed with  staples.  Sponge, needle, and instrument counts were all correct.  Sterile dressings were applied.  The patient tolerated the procedure  well without apparent complication and was taken to the recovery room in  stable condition and I requested a consultation from [facility], Stan Head for possible ERCP.      Barbara Miles, M.D.  Electronically Signed     BET/MEDQ  D:  07/12/2009  T:  07/13/2009  Job:  [id number]   cc:   Iva Boop,  MD,FACG
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
## 2011-05-14 ENCOUNTER — Other Ambulatory Visit: Payer: Self-pay | Admitting: Family Medicine

## 2011-05-14 NOTE — Telephone Encounter (Signed)
Refill request

## 2011-05-30 ENCOUNTER — Other Ambulatory Visit: Payer: Self-pay | Admitting: Family Medicine

## 2011-05-30 NOTE — Telephone Encounter (Signed)
Refill request

## 2011-06-28 ENCOUNTER — Other Ambulatory Visit: Payer: Self-pay | Admitting: Family Medicine

## 2011-06-28 DIAGNOSIS — IMO0001 Reserved for inherently not codable concepts without codable children: Secondary | ICD-10-CM

## 2011-06-28 NOTE — Assessment & Plan Note (Signed)
Refilled chronic pain meds 

## 2011-06-28 NOTE — Telephone Encounter (Signed)
Refill request

## 2011-07-01 ENCOUNTER — Telehealth: Payer: Self-pay | Admitting: Family Medicine

## 2011-07-01 NOTE — Telephone Encounter (Signed)
Pt wanted to let Dr Rexene Edison know that she fell on her steps last Thursday and just wanted him to be aware.  She slipped on second step and fell backwards.  This is just an [place]

## 2011-07-19 ENCOUNTER — Other Ambulatory Visit: Payer: Self-pay | Admitting: Family Medicine

## 2011-07-19 NOTE — Telephone Encounter (Signed)
Refill request for Dr. Hensel's patient. 

## 2011-07-24 ENCOUNTER — Other Ambulatory Visit: Payer: Self-pay | Admitting: Family Medicine

## 2011-07-24 NOTE — Telephone Encounter (Signed)
Refill request

## 2011-07-25 NOTE — Telephone Encounter (Signed)
Refilled this controled substance by phone based on e request.

## 2011-07-29 ENCOUNTER — Other Ambulatory Visit: Payer: Self-pay | Admitting: Family Medicine

## 2011-07-29 NOTE — Telephone Encounter (Signed)
Is calling because [facility] on [address] is saying that they didn't receive the Rx for Alprazolam.  I let the patient know that we show it went on 9/5.  She is going to call and speak to a Pharmacist, but if it still isn't there, she will call back.

## 2011-07-29 NOTE — Telephone Encounter (Signed)
Called rx into pharmacy, when I called patient she stated that she has just found out that pharmacy did receive this but had different number

## 2011-12-19 ENCOUNTER — Other Ambulatory Visit: Payer: Self-pay | Admitting: Family Medicine

## 2011-12-19 NOTE — Telephone Encounter (Signed)
Refill request

## 2011-12-20 ENCOUNTER — Other Ambulatory Visit: Payer: Self-pay | Admitting: Family Medicine

## 2011-12-20 NOTE — Telephone Encounter (Signed)
Refill request

## 2011-12-23 ENCOUNTER — Other Ambulatory Visit: Payer: Self-pay | Admitting: Family Medicine

## 2011-12-23 DIAGNOSIS — IMO0001 Reserved for inherently not codable concepts without codable children: Secondary | ICD-10-CM

## 2011-12-23 MED ORDER — HYDROCODONE-ACETAMINOPHEN 7.5-750 MG PO TABS: 1.0000 | ORAL_TABLET | Freq: Three times a day (TID) | ORAL | Status: DC | PRN

## 2011-12-23 NOTE — Telephone Encounter (Signed)
Can we call enough in until Friday and let her know that we will not fill until an appointment if she misses friday's

## 2011-12-23 NOTE — Telephone Encounter (Signed)
Called in Rx and notified patient

## 2011-12-23 NOTE — Telephone Encounter (Signed)
Barbara Miles has an appt with Dr. Leveda Anna on Friday with her husband, but she would like her Vicodin called in before hand because she is out.  At least enough to last until her appt.  Needs to go to [facility]: [facility] [address]

## 2011-12-27 ENCOUNTER — Ambulatory Visit (INDEPENDENT_AMBULATORY_CARE_PROVIDER_SITE_OTHER): Payer: Self-pay | Admitting: Family Medicine

## 2011-12-27 ENCOUNTER — Encounter: Payer: Self-pay | Admitting: Family Medicine

## 2011-12-27 DIAGNOSIS — Z23 Encounter for immunization: Secondary | ICD-10-CM

## 2011-12-27 DIAGNOSIS — E7800 Pure hypercholesterolemia, unspecified: Secondary | ICD-10-CM

## 2011-12-27 DIAGNOSIS — I10 Essential (primary) hypertension: Secondary | ICD-10-CM

## 2011-12-27 DIAGNOSIS — IMO0001 Reserved for inherently not codable concepts without codable children: Secondary | ICD-10-CM

## 2011-12-27 MED ORDER — HYDROCODONE-ACETAMINOPHEN 7.5-750 MG PO TABS: 1.0000 | ORAL_TABLET | Freq: Three times a day (TID) | ORAL | Status: DC | PRN

## 2011-12-27 NOTE — Assessment & Plan Note (Signed)
Well controled, check CMP

## 2011-12-27 NOTE — Assessment & Plan Note (Signed)
Not taking statin,  Will check lipid panel and consider $4 med.

## 2011-12-27 NOTE — Progress Notes (Signed)
  Subjective:    Patient ID: Barbara Miles, female    DOB: Aug 14, 1951, 61 y.o.   MRN: [id number]  HPI  Patient has largely been doing well.  Has a few issues 1. HBP well controled on meds without side effects. 2. She has not been taking a statin.  Concerned about cost.  3. Chronic vein problems of legs. 4. Chronic knee and leg pain from OA.  The hydrocodone keeps her active. 5. Way behind on health maint.  Cost is an issue and she refuses several interventions.     Review of Systems     Objective:   Physical Exam        Assessment & Plan:

## 2011-12-27 NOTE — Assessment & Plan Note (Signed)
60 hydrocodone per month with no early refills.

## 2011-12-27 NOTE — Patient Instructions (Signed)
Please get your mammogram and send back your poop cards I will call with blood work results I increased your chronic pain meds. You got a tetanus shot today.  They are good for 10 years

## 2011-12-28 LAB — COMPLETE METABOLIC PANEL WITH GFR
Albumin: 4.2 g/dL (ref 3.5–5.2)
CO2: 28 mEq/L (ref 19–32)
Chloride: 98 mEq/L (ref 96–112)
GFR, Est African American: >89 mL/min
GFR, Est Non African American: >89 mL/min
Glucose, Bld: 81 mg/dL (ref 70–99)
Potassium: 4 mEq/L (ref 3.5–5.3)
Sodium: 139 mEq/L (ref 135–145)
Total Protein: 7.1 g/dL (ref 6.0–8.3)

## 2011-12-30 ENCOUNTER — Encounter: Payer: Self-pay | Admitting: Family Medicine

## 2011-12-30 MED ORDER — PRAVASTATIN SODIUM 40 MG PO TABS: 40.0000 mg | ORAL_TABLET | Freq: Every evening | ORAL | Status: DC

## 2011-12-30 NOTE — Progress Notes (Signed)
Addended by: Tivis Ringer on: 12/30/2011 01:42 PM   Modules accepted: Orders

## 2012-03-10 ENCOUNTER — Other Ambulatory Visit: Payer: Self-pay | Admitting: Family Medicine

## 2012-04-28 ENCOUNTER — Telehealth: Payer: Self-pay | Admitting: Family Medicine

## 2012-04-28 NOTE — Telephone Encounter (Signed)
Patient is calling to find out her and her husbands blood types.  She also wants to discuss the recent diagnosis of her husband.

## 2012-04-28 NOTE — Telephone Encounter (Signed)
Called and told we don't test for blood type.

## 2012-06-08 ENCOUNTER — Other Ambulatory Visit: Payer: Self-pay | Admitting: Family Medicine

## 2012-07-09 ENCOUNTER — Other Ambulatory Visit: Payer: Self-pay | Admitting: Family Medicine

## 2012-07-10 ENCOUNTER — Other Ambulatory Visit: Payer: Self-pay | Admitting: Family Medicine

## 2012-07-10 NOTE — Telephone Encounter (Signed)
Patient is calling to check the status of her refill on Hydrocodone.  She has 4 left, and said she has enough at least through tomorrow.

## 2012-07-10 NOTE — Telephone Encounter (Signed)
I called in Rx this morning.

## 2012-07-10 NOTE — Telephone Encounter (Signed)
Dr. Leveda Anna, It looks like this rx needs to be printed out instead

## 2012-07-13 NOTE — Telephone Encounter (Signed)
Spoke with patient and she stated that her husband did pick up the rx for her at pharmacy

## 2012-08-11 ENCOUNTER — Other Ambulatory Visit: Payer: Self-pay | Admitting: Family Medicine

## 2012-11-13 ENCOUNTER — Encounter: Payer: Self-pay | Admitting: Family Medicine

## 2012-11-13 ENCOUNTER — Other Ambulatory Visit: Payer: Self-pay | Admitting: Family Medicine

## 2012-11-13 DIAGNOSIS — IMO0001 Reserved for inherently not codable concepts without codable children: Secondary | ICD-10-CM

## 2012-11-13 MED ORDER — HYDROCODONE-ACETAMINOPHEN 7.5-300 MG PO TABS: 1.0000 | ORAL_TABLET | Freq: Three times a day (TID) | ORAL | Status: DC | PRN

## 2012-11-13 NOTE — Telephone Encounter (Signed)
This encounter was created in error - please disregard.

## 2012-11-13 NOTE — Assessment & Plan Note (Signed)
Fax from [facility] saying vicodin 7.5/750 no longer available.  Asked to switch to vicodin 7.5/325

## 2012-11-13 NOTE — Telephone Encounter (Signed)
Patient is considering changing pharmacy to [facility].  She wants a refill on her Vicodin, but feels that she has had so much trouble with [facility] that she is going to check out the prices at [facility] and will call back later today to let Dr. Leveda Anna know where she would like her refill to go to.

## 2012-11-13 NOTE — Telephone Encounter (Signed)
error 

## 2012-11-20 ENCOUNTER — Emergency Department (HOSPITAL_COMMUNITY): Payer: Self-pay

## 2012-11-20 ENCOUNTER — Emergency Department (HOSPITAL_COMMUNITY)
Admission: EM | Admit: 2012-11-20 | Discharge: 2012-11-20 | Disposition: A | Discharge status: Home / Self Care | Payer: Self-pay | Attending: Emergency Medicine | Admitting: Emergency Medicine

## 2012-11-20 ENCOUNTER — Ambulatory Visit: Payer: Self-pay

## 2012-11-20 ENCOUNTER — Encounter (HOSPITAL_COMMUNITY): Payer: Self-pay | Admitting: Emergency Medicine

## 2012-11-20 ENCOUNTER — Telehealth: Payer: Self-pay | Admitting: Family Medicine

## 2012-11-20 DIAGNOSIS — Z9089 Acquired absence of other organs: Secondary | ICD-10-CM | POA: Insufficient documentation

## 2012-11-20 DIAGNOSIS — K389 Disease of appendix, unspecified: Secondary | ICD-10-CM | POA: Insufficient documentation

## 2012-11-20 DIAGNOSIS — I10 Essential (primary) hypertension: Secondary | ICD-10-CM | POA: Insufficient documentation

## 2012-11-20 DIAGNOSIS — Z87891 Personal history of nicotine dependence: Secondary | ICD-10-CM | POA: Insufficient documentation

## 2012-11-20 DIAGNOSIS — Z79899 Other long term (current) drug therapy: Secondary | ICD-10-CM | POA: Insufficient documentation

## 2012-11-20 HISTORY — DX: Essential (primary) hypertension: I10

## 2012-11-20 LAB — COMPREHENSIVE METABOLIC PANEL
AST: 25 U/L (ref 0–37)
Albumin: 3.5 g/dL (ref 3.5–5.2)
BUN: 14 mg/dL (ref 6–23)
Calcium: 10.2 mg/dL (ref 8.4–10.5)
Chloride: 96 mEq/L (ref 96–112)
Creatinine, Ser: 0.72 mg/dL (ref 0.50–1.10)
Total Protein: 7.4 g/dL (ref 6.0–8.3)

## 2012-11-20 LAB — CBC WITH DIFFERENTIAL/PLATELET
Basophils Absolute: 0 10*3/uL (ref 0.0–0.1)
Basophils Relative: 0 % (ref 0–1)
Eosinophils Absolute: 0.3 10*3/uL (ref 0.0–0.7)
Eosinophils Relative: 3 % (ref 0–5)
HCT: 43 % (ref 36.0–46.0)
MCH: 28.1 pg (ref 26.0–34.0)
MCHC: 33.7 g/dL (ref 30.0–36.0)
Monocytes Absolute: 0.8 10*3/uL (ref 0.1–1.0)
Neutro Abs: 6.7 10*3/uL (ref 1.7–7.7)
RDW: 13.9 % (ref 11.5–15.5)

## 2012-11-20 LAB — URINALYSIS, ROUTINE W REFLEX MICROSCOPIC
Glucose, UA: NEGATIVE mg/dL
Leukocytes, UA: NEGATIVE
Nitrite: NEGATIVE
Specific Gravity, Urine: 1.015 (ref 1.005–1.030)
pH: 7 (ref 5.0–8.0)

## 2012-11-20 LAB — URINE MICROSCOPIC-ADD ON

## 2012-11-20 LAB — LIPASE, BLOOD: Lipase: 44 U/L (ref 11–59)

## 2012-11-20 MED ORDER — HYDROMORPHONE HCL PF 1 MG/ML IJ SOLN
INTRAMUSCULAR | Status: AC
  Filled 2012-11-20: qty 1

## 2012-11-20 MED ORDER — HYDROMORPHONE HCL PF 1 MG/ML IJ SOLN
1.0000 mg | Freq: Once | INTRAMUSCULAR | Status: AC
  Administered 2012-11-20: 1 mg via INTRAVENOUS

## 2012-11-20 MED ORDER — AMOXICILLIN-POT CLAVULANATE 875-125 MG PO TABS: 1.0000 | ORAL_TABLET | Freq: Two times a day (BID) | ORAL | Status: DC

## 2012-11-20 MED ORDER — ONDANSETRON HCL 4 MG/2ML IJ SOLN
INTRAMUSCULAR | Status: AC
  Administered 2012-11-20: 4 mg
  Filled 2012-11-20: qty 2

## 2012-11-20 MED ORDER — SODIUM CHLORIDE 0.9 % IV SOLN
Freq: Once | INTRAVENOUS | Status: AC
  Administered 2012-11-20: 12:00:00 via INTRAVENOUS

## 2012-11-20 MED ORDER — IOHEXOL 300 MG/ML  SOLN
100.0000 mL | Freq: Once | INTRAMUSCULAR | Status: AC | PRN
  Administered 2012-11-20: 100 mL via INTRAVENOUS

## 2012-11-20 MED ORDER — AMOXICILLIN-POT CLAVULANATE 875-125 MG PO TABS
1.0000 | ORAL_TABLET | Freq: Once | ORAL | Status: AC
  Administered 2012-11-20: 1 via ORAL
  Filled 2012-11-20: qty 1

## 2012-11-20 NOTE — Telephone Encounter (Signed)
C/o "severe pain in center of stomach."  Pain is worse with BM.  "Thinks there is a knot and sore to the touch."  Appt scheduled for today with overflow clinic at 1:30 pm.  Gaylene Brooks, RN
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
## 2012-11-20 NOTE — Telephone Encounter (Signed)
Patient has been experiencing tenderness to the abdomen for 3 days.  No fever, no diahrrea, no nausea or vomiting.  She would like to speak to the nurse to determine if this is something she needs to be seen for right away or if it can wait until next week for her to come in to [facility].

## 2012-11-20 NOTE — ED Notes (Signed)
Increased abd pain around umbilicus for 5 days. Denies other symptoms.

## 2012-11-23 ENCOUNTER — Telehealth: Payer: Self-pay | Admitting: Family Medicine

## 2012-11-23 NOTE — Telephone Encounter (Signed)
Reviewed ER visit, labs and CT results.  She feels pain.  No fever, nausea, vomiting or GI changes.  She is neither better nor worse.  CT shows 4 cm spherical area of inflamation in epigastrium at midline.  No clear etiology.  Asked to come for office visit since no red flags but no clear cut etiology.  OK to double book.

## 2012-11-23 NOTE — Telephone Encounter (Signed)
Spoke with patient and she states that the hydrocodone is only lasting 3 hours and at times she will take an extra half of pill to help her get through pain. She would like to get something stronger if possible because she is in a lot of pain.

## 2012-11-23 NOTE — Telephone Encounter (Signed)
Patient is calling to let Dr. Leveda Anna know that she her pain got so bad that she went to the ER.  She was told at the ER that she could double up on her Hydrocodone, but that isn't working.  She would like to speak to Dr. Leveda Anna about this.  She is concerned that she may take too much.

## 2012-11-25 ENCOUNTER — Ambulatory Visit (INDEPENDENT_AMBULATORY_CARE_PROVIDER_SITE_OTHER): Payer: Self-pay | Admitting: Family Medicine

## 2012-11-25 ENCOUNTER — Encounter: Payer: Self-pay | Admitting: Family Medicine

## 2012-11-25 VITALS — BP 147/61 | HR 80 | Temp 98.2°F | Wt 198.2 lb

## 2012-11-25 DIAGNOSIS — R109 Unspecified abdominal pain: Secondary | ICD-10-CM

## 2012-11-25 MED ORDER — OXYCODONE-ACETAMINOPHEN 10-325 MG PO TABS: 1.0000 | ORAL_TABLET | Freq: Three times a day (TID) | ORAL | Status: DC | PRN

## 2012-11-26 ENCOUNTER — Encounter (HOSPITAL_COMMUNITY): Payer: Self-pay

## 2012-11-26 ENCOUNTER — Telehealth: Payer: Self-pay | Admitting: Family Medicine

## 2012-11-26 ENCOUNTER — Ambulatory Visit (HOSPITAL_COMMUNITY)
Admission: RE | Admit: 2012-11-26 | Discharge: 2012-11-26 | Disposition: A | Discharge status: Home / Self Care | Payer: Self-pay | Source: Ambulatory Visit | Attending: Family Medicine | Admitting: Family Medicine

## 2012-11-26 DIAGNOSIS — J984 Other disorders of lung: Secondary | ICD-10-CM | POA: Insufficient documentation

## 2012-11-26 DIAGNOSIS — R109 Unspecified abdominal pain: Secondary | ICD-10-CM

## 2012-11-26 DIAGNOSIS — IMO0002 Reserved for concepts with insufficient information to code with codable children: Secondary | ICD-10-CM

## 2012-11-26 DIAGNOSIS — L02219 Cutaneous abscess of trunk, unspecified: Secondary | ICD-10-CM | POA: Insufficient documentation

## 2012-11-26 MED ORDER — ONDANSETRON HCL 4 MG/2ML IJ SOLN
4.0000 mg | Freq: Once | INTRAMUSCULAR | Status: AC
  Administered 2012-11-26: 4 mg via INTRAVENOUS
  Filled 2012-11-26: qty 2

## 2012-11-26 MED ORDER — ONDANSETRON HCL 4 MG/2ML IJ SOLN
INTRAMUSCULAR | Status: AC
  Filled 2012-11-26: qty 4

## 2012-11-26 MED ORDER — IOHEXOL 180 MG/ML  SOLN: 20.0000 mL | Freq: Once | INTRAMUSCULAR | Status: AC | PRN

## 2012-11-26 MED ORDER — IOHEXOL 300 MG/ML  SOLN
100.0000 mL | Freq: Once | INTRAMUSCULAR | Status: AC | PRN
  Administered 2012-11-26: 100 mL via INTRAVENOUS

## 2012-11-26 MED ORDER — IOHEXOL 300 MG/ML  SOLN
25.0000 mL | INTRAMUSCULAR | Status: AC
  Administered 2012-11-26 (×2): 25 mL via ORAL

## 2012-11-26 NOTE — Telephone Encounter (Addendum)
Received call from CT- patient's primary care physician ordered CT abdomen when seen in the office yesterday for follow-up of ER visit on 11/20/12 with CT finding of "4 cm area of ill-defined edema/inflammation in the omentum just deep to the midline rectus fascia about 4 cm cranial to the umbilicus."  CT today on 11/26/12 shows: "Again identified inflammatory process at the anterior abdominal wall, predominately external to the rectus fascia, increased in size since previous exam and now demonstrating a large central low attenuation region 4.5 x 3.2 x 3.6 cm most consistent with  developing abscess."  Discussed with patient - states her pain is stable, with no acute worsening of pain, no new redness, fever, or other concerns.  Informed patient I will call you with an appt time this afternoon for an appt with general surgery tomorrow.  Left message on patient's voicemail at home.  There was a misunderstanding and she was waiting at CT department, CT called and stated they gave her the above message.  Advised to continue Augmentin prescribed on Jan 3rd and will go to ER for any cute worsening overnight. Will see Dr. Andrey Campanile at [facility] tomorrow at 3:00.

## 2012-11-26 NOTE — Assessment & Plan Note (Signed)
Received call from CT- patient's primary care physician ordered CT abdomen when seen in the office yesterday for follow-up of ER visit on 11/20/12 with CT finding of "4 cm area of ill-defined edema/inflammation in the omentum just deep to the midline rectus fascia about 4 cm cranial to the umbilicus."   CT today on 11/26/12 shows: "Again identified inflammatory process at the anterior abdominal wall, predominately external to the rectus fascia, increased in size since previous exam and now demonstrating a large central low attenuation region 4.5 x 3.2 x 3.6 cm most consistent with developing abscess."   Discussed with patient - states her pain is stable, with no acute worsening of pain, no new redness, fever, or other concerns. Advised to continue Augmentin prescribed on Jan 3rd and will go to ER for any cute worsening overnight.   Will see Dr. Andrey Campanile at [facility] tomorrow at 3:00.

## 2012-11-27 ENCOUNTER — Telehealth: Payer: Self-pay | Admitting: Family Medicine

## 2012-11-27 ENCOUNTER — Ambulatory Visit (INDEPENDENT_AMBULATORY_CARE_PROVIDER_SITE_OTHER): Payer: Self-pay | Admitting: General Surgery

## 2012-11-27 ENCOUNTER — Encounter (INDEPENDENT_AMBULATORY_CARE_PROVIDER_SITE_OTHER): Payer: Self-pay | Admitting: General Surgery

## 2012-11-27 VITALS — BP 150/78 | HR 72 | Temp 97.4°F | Resp 18 | Ht 66.0 in | Wt 197.6 lb

## 2012-11-27 DIAGNOSIS — K5289 Other specified noninfective gastroenteritis and colitis: Secondary | ICD-10-CM

## 2012-11-27 DIAGNOSIS — L02211 Cutaneous abscess of abdominal wall: Secondary | ICD-10-CM | POA: Insufficient documentation

## 2012-11-27 DIAGNOSIS — R109 Unspecified abdominal pain: Secondary | ICD-10-CM

## 2012-11-27 DIAGNOSIS — L02219 Cutaneous abscess of trunk, unspecified: Secondary | ICD-10-CM

## 2012-11-27 DIAGNOSIS — L03319 Cellulitis of trunk, unspecified: Secondary | ICD-10-CM

## 2012-11-27 MED ORDER — OXYCODONE-ACETAMINOPHEN 10-325 MG PO TABS: 1.0000 | ORAL_TABLET | Freq: Three times a day (TID) | ORAL | Status: DC | PRN

## 2012-11-27 MED ORDER — AMOXICILLIN-POT CLAVULANATE 875-125 MG PO TABS: 1.0000 | ORAL_TABLET | Freq: Two times a day (BID) | ORAL | Status: DC
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
## 2012-11-27 NOTE — Patient Instructions (Addendum)
Drink 6-8 glasses of water per day Take a stool softner daily while on pain meds Take Miralax or Milk of Magnesia  Abscess Care After An abscess (also called a boil or furuncle) is an infected area that contains a collection of pus. Signs and symptoms of an abscess include pain, tenderness, redness, or hardness, or you may feel a moveable soft area under your skin. An abscess can occur anywhere in the body. The infection may spread to surrounding tissues causing cellulitis. A cut (incision) by the surgeon was made over your abscess and the pus was drained out. Gauze may have been packed into the space to provide a drain that will allow the cavity to heal from the inside outwards. The boil may be painful for 5 to 7 days. Most people with a boil do not have high fevers. Your abscess, if seen early, may not have localized, and may not have been lanced. If not, another appointment may be required for this if it does not get better on its own or with medications.  HOME CARE INSTRUCTIONS   Only take over-the-counter or prescription medicines for pain, discomfort, or fever as directed by your caregiver.   When you bathe, soak and then remove gauze and iodoform packs. You may then wash the wound gently with mild soapy water. Pack and cover with gauze or do as your caregiver directs.   SEEK IMMEDIATE MEDICAL CARE IF:   You develop increased pain, swelling, redness, drainage, or bleeding in the wound site.   You develop signs of generalized infection including muscle aches, chills, fever, or a general ill feeling.   An oral temperature above 102 F (38.9 C) develops, not controlled by medication.  See your caregiver for a recheck if you develop any of the symptoms described above. If medications (antibiotics) were prescribed, take them as directed.

## 2012-11-27 NOTE — Progress Notes (Signed)
  Subjective:    Patient ID: Barbara Miles, female    DOB: [birth year], 62 y.o.   MRN: [id number]  HPI Seen in ER 1/3 with abd pain.  CT showed ill defined area of inflamation caudad to umbilicus at midline anterior to the rectus sheath.  Given augmentin which she has not consistently taken.  Denies fever. No systemic symptoms.  Some nausea.  No vomiting or stool changes.  Pain is unchanged, neither better or worse.    Review of Systems     Objective:   Physical Exam Normal bowel sounds swelling and exquisite tenderness around umbilicus.  No skin redness or pointing.  No obvious fluctuance on exam.  Does have area of swelling/edema/fullness that measures 12x12 cm        Assessment & Plan:

## 2012-11-27 NOTE — Telephone Encounter (Signed)
Pt had CT yesterday and is very concerned and needs to speak to [facility] or nurse asap

## 2012-11-27 NOTE — Telephone Encounter (Signed)
Returned call to patient.  Wants to know if Dr. Leveda Anna was able to review her test results from yesterday.  Wants to know if Dr. Leveda Anna would be able to call her today before her 3:00 pm appt with [facility].  Informed patient that Dr. Leveda Anna is in clinic seeing patients this morning.  She may not receive call until end of clinic.  Gaylene Brooks, RN

## 2012-11-27 NOTE — Assessment & Plan Note (Signed)
Concern for worsening of abd inflamatory process given now measures 12x12 clinically.  Will get CT scan.  She is to take augmentin regularly.  Update. CT shows likely abscess - fortunately, size is not much different than first CT.  Will get surg referral for likely I&D

## 2012-11-27 NOTE — Telephone Encounter (Signed)
Called and discussed.  Told would need an I&D that likely can be done in the surgeons office.  Emphasized that it is important to keep the appointment.

## 2012-11-30 ENCOUNTER — Telehealth (INDEPENDENT_AMBULATORY_CARE_PROVIDER_SITE_OTHER): Payer: Self-pay | Admitting: General Surgery

## 2012-11-30 ENCOUNTER — Telehealth: Payer: Self-pay | Admitting: Family Medicine

## 2012-11-30 LAB — WOUND CULTURE: Organism ID, Bacteria: NO GROWTH

## 2012-11-30 MED ORDER — FLUCONAZOLE 100 MG PO TABS: 150.0000 mg | ORAL_TABLET | Freq: Every day | ORAL | Status: DC

## 2012-11-30 NOTE — Telephone Encounter (Signed)
Appt made 12/11/12 @ 2:10 pm. Patient aware. Also aware diflucan has been sent to pharmacy.

## 2012-11-30 NOTE — Telephone Encounter (Signed)
Pt called to request Diflucan for yeast infection, secondary to Augmentin.  Seen in office last Friday for abdominal wall abscess.  States she infrequently takes antibiotics, because she is prone to yeast infections.  Please send order to [facility]:  [phone] and let her know to pick it up.

## 2012-11-30 NOTE — Addendum Note (Signed)
Addended byLiliana Cline on: 11/30/2012 11:36 AM   Modules accepted: Orders

## 2012-11-30 NOTE — Telephone Encounter (Signed)
Error

## 2012-11-30 NOTE — Telephone Encounter (Signed)
Diflucan 150 mg #1 with no refill sent to [facility]. Tried to call patient with no answer and voicemail full.

## 2012-11-30 NOTE — Telephone Encounter (Signed)
Message copied by Liliana Cline on Mon Nov 30, 2012 12:41 PM ------      Message from: Leanne Chang      Created: Fri Nov 27, 2012  4:42 PM      Regarding: Dr Lanier Prude: [phone]       Need f/u appt around 12/11/12

## 2012-12-11 ENCOUNTER — Ambulatory Visit (INDEPENDENT_AMBULATORY_CARE_PROVIDER_SITE_OTHER): Payer: Self-pay | Admitting: General Surgery

## 2012-12-11 ENCOUNTER — Encounter (INDEPENDENT_AMBULATORY_CARE_PROVIDER_SITE_OTHER): Payer: Self-pay | Admitting: General Surgery

## 2012-12-11 VITALS — BP 128/83 | HR 82 | Temp 98.4°F | Resp 20 | Ht 66.0 in | Wt 193.8 lb

## 2012-12-11 DIAGNOSIS — L02211 Cutaneous abscess of abdominal wall: Secondary | ICD-10-CM

## 2012-12-11 DIAGNOSIS — L02219 Cutaneous abscess of trunk, unspecified: Secondary | ICD-10-CM

## 2012-12-11 NOTE — Patient Instructions (Signed)
Continue current wound care

## 2012-12-11 NOTE — Progress Notes (Signed)
Subjective:     Patient ID: Barbara Miles, female   DOB: [birth year], 62 y.o.   MRN: [id number]  HPI 62 year old Caucasian female comes in for recheck after undergoing incision and drainage of a large supraumbilical abscess in the office on January 10. Her CT scan that only showed a deep abdominal wall abscess also a small component of inflammation just posterior to her abdominal wall muscle. This is read as epiploic appendicitis. However this abscess in her abdominal wall was underneath a prior incision from her laparoscopic cholecystectomy. It is possible that this could represent a piece of incarcerated fat that pussed out.  She states that she is she says since her last visit. She denies any fevers, chills, nausea or vomiting. She reports regular bowel movements. She has been doing daily dressing change. She completed her antibiotic course on Tuesday. She denies any drainage. The right-sided abdominal wall pain has stopped.  Review of Systems     Objective:   Physical Exam BP 128/83  Pulse 82  Temp 98.4 F (36.9 C) (Temporal)  Resp 20  Ht 5\' 6"  (1.676 m)  Wt 193 lb 12.8 oz (87.907 kg)  BMI 31.28 kg/m2 Alert, no apparent distress Abdomen-soft, nontender, nondistended, obese. Supraumbilical wound is about 1 inch long by Inch-wide. The depth is about 5 mm. There is great granulation tissue at the base of the wound. There is no surrounding cellulitis, induration, or fluctuance. I cannot express any drainage.    Assessment:     Status post incision and drainage of large supraumbilical abscess    Plan:     Is still unclear as to the exact etiology of this  Supraumbilical abscess; however, the patient is clinically significantly improved. I do not believe she needs any additional antibiotics. Her wound cultures did not grow out any bacteria. She is to continue current wound care. Followup with me in 6 weeks for wound check. I again, encouraged the patient to discuss getting a colonoscopy  with her primary care physician.  Mary Sella. Andrey Campanile, MD, FACS General, Bariatric, & Minimally Invasive Surgery [facility], [place]

## 2013-01-21 ENCOUNTER — Encounter (INDEPENDENT_AMBULATORY_CARE_PROVIDER_SITE_OTHER): Payer: Self-pay | Admitting: General Surgery

## 2013-02-05 ENCOUNTER — Telehealth (INDEPENDENT_AMBULATORY_CARE_PROVIDER_SITE_OTHER): Payer: Self-pay | Admitting: General Surgery

## 2013-02-05 NOTE — Telephone Encounter (Signed)
LMOM for patient making her aware I moved her appt from 02/25/2013 to 02/22/2013 due to MD bumping schedule. She is to call back if the new date and time do not work for her.

## 2013-02-22 ENCOUNTER — Encounter (INDEPENDENT_AMBULATORY_CARE_PROVIDER_SITE_OTHER): Payer: Self-pay | Admitting: General Surgery

## 2013-02-25 ENCOUNTER — Encounter (INDEPENDENT_AMBULATORY_CARE_PROVIDER_SITE_OTHER): Payer: Self-pay | Admitting: General Surgery

## 2013-05-17 ENCOUNTER — Other Ambulatory Visit: Payer: Self-pay | Admitting: Family Medicine

## 2013-05-28 ENCOUNTER — Ambulatory Visit (INDEPENDENT_AMBULATORY_CARE_PROVIDER_SITE_OTHER): Payer: BC Managed Care – PPO | Admitting: Family Medicine

## 2013-05-28 ENCOUNTER — Encounter: Payer: Self-pay | Admitting: Family Medicine

## 2013-05-28 VITALS — BP 128/60 | HR 82 | Temp 98.7°F | Ht 66.0 in | Wt 204.6 lb

## 2013-05-28 DIAGNOSIS — I872 Venous insufficiency (chronic) (peripheral): Secondary | ICD-10-CM

## 2013-05-28 DIAGNOSIS — I10 Essential (primary) hypertension: Secondary | ICD-10-CM

## 2013-05-28 DIAGNOSIS — IMO0001 Reserved for inherently not codable concepts without codable children: Secondary | ICD-10-CM

## 2013-05-28 MED ORDER — HYDROCODONE-ACETAMINOPHEN 7.5-325 MG PO TABS: ORAL_TABLET | ORAL | Status: DC

## 2013-05-28 NOTE — Assessment & Plan Note (Signed)
Control OK - stay on same meds.

## 2013-05-28 NOTE — Assessment & Plan Note (Signed)
Chronic leg pain due to combo of factors.
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
## 2013-05-28 NOTE — Assessment & Plan Note (Signed)
Cont chronic pain meds.

## 2013-05-28 NOTE — Patient Instructions (Addendum)
You are due for several things. Mammogram, pap smear and blood work.  See me again once you get the orange card.

## 2013-09-16 ENCOUNTER — Other Ambulatory Visit: Payer: Self-pay | Admitting: Family Medicine

## 2013-09-16 MED ORDER — CYCLOBENZAPRINE HCL 10 MG PO TABS: 10.0000 mg | ORAL_TABLET | Freq: Every day | ORAL | Status: DC

## 2013-09-20 ENCOUNTER — Telehealth: Payer: Self-pay | Admitting: Family Medicine

## 2013-09-20 DIAGNOSIS — I872 Venous insufficiency (chronic) (peripheral): Secondary | ICD-10-CM

## 2013-09-20 DIAGNOSIS — IMO0001 Reserved for inherently not codable concepts without codable children: Secondary | ICD-10-CM

## 2013-09-20 MED ORDER — HYDROCODONE-ACETAMINOPHEN 7.5-325 MG PO TABS: ORAL_TABLET | ORAL | Status: DC

## 2013-09-20 NOTE — Telephone Encounter (Signed)
Pt needs a refill on her hydrocodone left up front for pick up. JW

## 2013-09-20 NOTE — Telephone Encounter (Signed)
Will address change in status of hydrocodone and new clinic policy next visit.

## 2013-09-23 ENCOUNTER — Other Ambulatory Visit: Payer: Self-pay

## 2013-11-03 ENCOUNTER — Ambulatory Visit (INDEPENDENT_AMBULATORY_CARE_PROVIDER_SITE_OTHER): Payer: BC Managed Care – PPO | Admitting: Family Medicine

## 2013-11-03 VITALS — BP 147/63 | HR 83 | Temp 99.5°F | Ht 66.0 in | Wt 200.0 lb

## 2013-11-03 DIAGNOSIS — I10 Essential (primary) hypertension: Secondary | ICD-10-CM

## 2013-11-03 DIAGNOSIS — Z23 Encounter for immunization: Secondary | ICD-10-CM

## 2013-11-03 DIAGNOSIS — IMO0001 Reserved for inherently not codable concepts without codable children: Secondary | ICD-10-CM

## 2013-11-03 DIAGNOSIS — E7800 Pure hypercholesterolemia, unspecified: Secondary | ICD-10-CM

## 2013-11-03 DIAGNOSIS — E669 Obesity, unspecified: Secondary | ICD-10-CM

## 2013-11-03 DIAGNOSIS — F411 Generalized anxiety disorder: Secondary | ICD-10-CM

## 2013-11-03 DIAGNOSIS — K432 Incisional hernia without obstruction or gangrene: Secondary | ICD-10-CM

## 2013-11-03 LAB — COMPLETE METABOLIC PANEL WITH GFR
AST: 38 U/L — ABNORMAL HIGH (ref 0–37)
Albumin: 4.4 g/dL (ref 3.5–5.2)
Alkaline Phosphatase: 66 U/L (ref 39–117)
BUN: 19 mg/dL (ref 6–23)
Calcium: 10 mg/dL (ref 8.4–10.5)
Chloride: 100 mEq/L (ref 96–112)
Creat: 0.74 mg/dL (ref 0.50–1.10)
Glucose, Bld: 92 mg/dL (ref 70–99)
Potassium: 3.9 mEq/L (ref 3.5–5.3)
Sodium: 140 mEq/L (ref 135–145)
Total Protein: 7.5 g/dL (ref 6.0–8.3)

## 2013-11-03 LAB — LIPID PANEL
Cholesterol: 232 mg/dL — ABNORMAL HIGH (ref 0–200)
VLDL: 35 mg/dL (ref 0–40)

## 2013-11-03 MED ORDER — HYDROCODONE-ACETAMINOPHEN 7.5-325 MG/15ML PO SOLN: 10.0000 mL | Freq: Three times a day (TID) | ORAL | Status: DC | PRN

## 2013-11-03 MED ORDER — ZOSTER VACCINE LIVE 19400 UNT/0.65ML ~~LOC~~ SOLR: 0.6500 mL | Freq: Once | SUBCUTANEOUS | Status: DC

## 2013-11-03 MED ORDER — ALPRAZOLAM 0.5 MG PO TBDP: 0.5000 mg | ORAL_TABLET | Freq: Two times a day (BID) | ORAL | Status: DC | PRN

## 2013-11-03 MED ORDER — HYDROCODONE-ACETAMINOPHEN 7.5-325 MG PO TABS: ORAL_TABLET | ORAL | Status: DC

## 2013-11-03 NOTE — Patient Instructions (Addendum)
I have given you 2 two-month prescriptions for your pain pills.   See me in 4 month, I will do a pap test. Get your mammogram Do your poop cards I sent a prescription for the shingles vaccine The nurse will give you a flu shot. The alprazolam in not refillable.

## 2013-11-04 ENCOUNTER — Encounter: Payer: Self-pay | Admitting: Family Medicine

## 2013-11-04 DIAGNOSIS — K432 Incisional hernia without obstruction or gangrene: Secondary | ICD-10-CM | POA: Insufficient documentation

## 2013-11-04 HISTORY — DX: Incisional hernia without obstruction or gangrene: K43.2

## 2013-11-04 NOTE — Progress Notes (Signed)
   Subjective:    Patient ID: Barbara Miles, female    DOB: 1951-02-17, 62 y.o.   MRN: [id number]  HPI  Barbara Miles is doing well and generally happy.  Her husband has stabilized after a bad year (Parkinsons, CAD with stents and disability.)  Finances have stabilized and they now have insurance through the [employer] site - big success for them.  She is way behind on health maint.  Refuses colonoscopy but states she will do hemocults (she has been given before and not returned them.  Needs pap, mammo, lipids, flu and shingles vaccine.    Chronic pain is stable.  Anxiety is worse, not sleeping  Daughter in law recently diagnosed with metastatic breast cancer and is undergoing chemo.  Has intermitent abd pain and swelling around laporoscope incision site    Review of Systems     Objective:   Physical Exam Cardiac RRR without m or g Lungs clear Abd benign.  Can't be sure but feels like she has small incisional hernia.       Assessment & Plan:

## 2013-11-04 NOTE — Assessment & Plan Note (Signed)
Stable on pain meds

## 2013-11-04 NOTE — Assessment & Plan Note (Signed)
Fair control.  With weight coming down, no changes to meds.

## 2013-11-04 NOTE — Assessment & Plan Note (Signed)
Check labs 

## 2013-11-04 NOTE — Assessment & Plan Note (Signed)
Small, questionable hernia.  Watch.  Given warning signs for incarceration.

## 2013-11-04 NOTE — Assessment & Plan Note (Signed)
Temporary alprazolam

## 2013-11-04 NOTE — Assessment & Plan Note (Signed)
Nice wt loss. 

## 2013-11-15 ENCOUNTER — Telehealth: Payer: Self-pay | Admitting: Family Medicine

## 2013-11-15 MED ORDER — ALPRAZOLAM 0.5 MG PO TABS: 0.5000 mg | ORAL_TABLET | Freq: Two times a day (BID) | ORAL | Status: DC | PRN

## 2013-11-15 NOTE — Telephone Encounter (Signed)
My mistake.  I inadertantly printed the Rx for disolving tabs, not regular tabs.  I called in the appropriate Rx to the phamacy.  Patient notified.

## 2013-11-15 NOTE — Telephone Encounter (Signed)
Pt called because alprazolam is not available at her pharmacy and was told to take it it somewhere else. She does not want to do that and wanted the doctor to prescribe something else that she could get at her pharmacy. jw

## 2013-12-06 ENCOUNTER — Other Ambulatory Visit: Payer: Self-pay | Admitting: Family Medicine

## 2013-12-06 DIAGNOSIS — I10 Essential (primary) hypertension: Secondary | ICD-10-CM

## 2013-12-06 MED ORDER — LISINOPRIL-HYDROCHLOROTHIAZIDE 20-25 MG PO TABS: 1.0000 | ORAL_TABLET | Freq: Every day | ORAL | Status: DC

## 2014-01-18 ENCOUNTER — Telehealth: Payer: Self-pay | Admitting: Family Medicine

## 2014-01-18 DIAGNOSIS — IMO0001 Reserved for inherently not codable concepts without codable children: Secondary | ICD-10-CM

## 2014-01-18 MED ORDER — HYDROCODONE-ACETAMINOPHEN 7.5-325 MG PO TABS: ORAL_TABLET | ORAL | Status: DC

## 2014-01-18 NOTE — Telephone Encounter (Signed)
Pt states her hydro codeone was filled with the liquid. An new prescription needs to be written hydrocodone pills 7.5 Please advise

## 2014-01-18 NOTE — Telephone Encounter (Signed)
Patient is correct.  I erred in creating the prescription.  New one left up front. Patient notified.

## 2014-01-19 ENCOUNTER — Telehealth: Payer: Self-pay | Admitting: Family Medicine

## 2014-03-03 ENCOUNTER — Other Ambulatory Visit: Payer: Self-pay | Admitting: Family Medicine

## 2014-03-03 MED ORDER — ALPRAZOLAM 0.5 MG PO TABS: 0.5000 mg | ORAL_TABLET | Freq: Two times a day (BID) | ORAL | Status: DC | PRN

## 2014-03-03 NOTE — Telephone Encounter (Signed)
Refilled in response to fax request from pharmacy.

## 2014-03-23 ENCOUNTER — Telehealth: Payer: Self-pay | Admitting: Family Medicine

## 2014-03-23 NOTE — Telephone Encounter (Signed)
Pt called and needs a refill on her Hydrocodone left up front. Please call when ready. jw °

## 2014-03-23 NOTE — Telephone Encounter (Signed)
Dear Barbara Miles Team I have handwritten rx and it is up front Please let her know THANKS! Barbara Miles

## 2014-03-23 NOTE — Telephone Encounter (Signed)
Relayed message,patient voiced understanding.[place]

## 2014-05-26 ENCOUNTER — Telehealth: Payer: Self-pay | Admitting: Family Medicine

## 2014-05-26 DIAGNOSIS — IMO0001 Reserved for inherently not codable concepts without codable children: Secondary | ICD-10-CM

## 2014-05-26 MED ORDER — HYDROCODONE-ACETAMINOPHEN 7.5-325 MG PO TABS: ORAL_TABLET | ORAL | Status: DC

## 2014-05-26 NOTE — Telephone Encounter (Signed)
Pt called and would like a refill on her Hydrocodone left up front. Please call when ready and she is hoping to pick it up today since she will be leaving to go out of town tomorrow. jw

## 2014-05-26 NOTE — Telephone Encounter (Signed)
Will do.  Informed that she will need an appointment before next Rx.

## 2014-06-01 ENCOUNTER — Ambulatory Visit (INDEPENDENT_AMBULATORY_CARE_PROVIDER_SITE_OTHER): Payer: BC Managed Care – PPO | Admitting: Family Medicine

## 2014-06-01 ENCOUNTER — Other Ambulatory Visit: Payer: Self-pay | Admitting: Family Medicine

## 2014-06-01 ENCOUNTER — Encounter: Payer: Self-pay | Admitting: Family Medicine

## 2014-06-01 VITALS — BP 146/74 | HR 73 | Temp 99.0°F | Ht 66.0 in | Wt 206.0 lb

## 2014-06-01 DIAGNOSIS — R918 Other nonspecific abnormal finding of lung field: Secondary | ICD-10-CM

## 2014-06-01 DIAGNOSIS — K668 Other specified disorders of peritoneum: Secondary | ICD-10-CM

## 2014-06-01 DIAGNOSIS — IMO0002 Reserved for concepts with insufficient information to code with codable children: Secondary | ICD-10-CM

## 2014-06-01 DIAGNOSIS — Z1231 Encounter for screening mammogram for malignant neoplasm of breast: Secondary | ICD-10-CM

## 2014-06-01 DIAGNOSIS — I10 Essential (primary) hypertension: Secondary | ICD-10-CM

## 2014-06-01 DIAGNOSIS — I872 Venous insufficiency (chronic) (peripheral): Secondary | ICD-10-CM

## 2014-06-01 DIAGNOSIS — Z1211 Encounter for screening for malignant neoplasm of colon: Secondary | ICD-10-CM

## 2014-06-01 DIAGNOSIS — Z1239 Encounter for other screening for malignant neoplasm of breast: Secondary | ICD-10-CM

## 2014-06-01 DIAGNOSIS — IMO0001 Reserved for inherently not codable concepts without codable children: Secondary | ICD-10-CM

## 2014-06-01 HISTORY — DX: Other nonspecific abnormal finding of lung field: R91.8

## 2014-06-01 MED ORDER — ZOSTER VACCINE LIVE 19400 UNT/0.65ML ~~LOC~~ SOLR: 0.6500 mL | Freq: Once | SUBCUTANEOUS | Status: DC

## 2014-06-01 NOTE — Patient Instructions (Signed)
Get your mammogram Complete your poop cards See me in a month or so for Pap smear Get the chest ct scan.  Check your blood pressure at home and bring in those readings. Let me know if you get the shingles vaccine so that I can update my records.

## 2014-06-01 NOTE — Assessment & Plan Note (Signed)
Only intervene if signs of infection.

## 2014-06-01 NOTE — Assessment & Plan Note (Signed)
Continue pain meds as per verbal contract.

## 2014-06-01 NOTE — Assessment & Plan Note (Signed)
Home BPs are reportedly good.  Will have her monitor and bring in results.

## 2014-06-01 NOTE — Progress Notes (Signed)
   Subjective:    Patient ID: Barbara Miles, female    DOB: 1951/10/29, 63 y.o.   MRN: [id number]  HPI Two issues in addition to routine FU. Having some abd swelling at site of previous abscess/traumatic fat necrosis drainage.  No pain or fever.  This was not an incisional hernia. Worried about worsening varicous veins.  Not asking for surgical removal.  Uses compression stockings.  No history of clot.  Daily ASA is for clot prevention. Needs PAP, colon cancer screen, mammogram and shingles vaccine.  Now has insurance.    Review of Systems     Objective:   Physical Exam BP noted to be elevated.  Confirmed on repeat. Lungs clear Cardiac RRR without m or g Abd - slight fullness near umbilicus/incision from hernia repair.  No inflamation, tenderness or redness. Legs: serpentiginous vericous veins bilaterally.        Assessment & Plan:

## 2014-06-01 NOTE — Assessment & Plan Note (Signed)
Reviewed old CT.  Discussed risk of lung Ca.  Generally low but is an ex smoker.  We decided together on FU CT.

## 2014-06-01 NOTE — Assessment & Plan Note (Signed)
No change in therapy

## 2014-06-01 NOTE — Assessment & Plan Note (Signed)
Needs not only mammo, but also Pap (she will return) and colon ca (chooses hemocults.)

## 2014-06-07 ENCOUNTER — Other Ambulatory Visit: Payer: BC Managed Care – PPO

## 2014-06-08 ENCOUNTER — Ambulatory Visit
Admission: RE | Admit: 2014-06-08 | Discharge: 2014-06-08 | Disposition: A | Discharge status: Home / Self Care | Payer: BC Managed Care – PPO | Source: Ambulatory Visit | Attending: Family Medicine | Admitting: Family Medicine

## 2014-06-08 DIAGNOSIS — Z1231 Encounter for screening mammogram for malignant neoplasm of breast: Secondary | ICD-10-CM

## 2014-06-08 DIAGNOSIS — R918 Other nonspecific abnormal finding of lung field: Secondary | ICD-10-CM

## 2014-06-08 LAB — POC HEMOCCULT BLD/STL (HOME/3-CARD/SCREEN)
Card #3 Fecal Occult Blood, POC: POSITIVE
FECAL OCCULT BLD: POSITIVE
FECAL OCCULT BLD: POSITIVE

## 2014-06-08 NOTE — Addendum Note (Signed)
Addended by: [place], Tewana Bohlen on: 06/08/2014 06:10 PM   Modules accepted: Orders

## 2014-06-09 ENCOUNTER — Other Ambulatory Visit: Payer: Self-pay | Admitting: Family Medicine

## 2014-06-09 DIAGNOSIS — K921 Melena: Secondary | ICD-10-CM

## 2014-06-09 NOTE — Assessment & Plan Note (Signed)
Called and informed needs colonoscopy

## 2014-06-09 NOTE — Progress Notes (Signed)
Has been placed in [facility]

## 2014-06-14 ENCOUNTER — Encounter: Payer: Self-pay | Admitting: Internal Medicine

## 2014-07-28 ENCOUNTER — Telehealth: Payer: Self-pay | Admitting: Family Medicine

## 2014-07-28 DIAGNOSIS — IMO0001 Reserved for inherently not codable concepts without codable children: Secondary | ICD-10-CM

## 2014-07-28 MED ORDER — HYDROCODONE-ACETAMINOPHEN 7.5-325 MG PO TABS: ORAL_TABLET | ORAL | Status: DC

## 2014-07-28 NOTE — Telephone Encounter (Signed)
Left message with spouse letting him know Rx was placed upfront for pick up.Gaynell Eggleton, Lewie Loron

## 2014-07-28 NOTE — Telephone Encounter (Signed)
Pt called and needs a refill on her Hydrocodone left up front. She would like a 3 month supply if possible. jw

## 2014-08-10 ENCOUNTER — Encounter: Payer: BC Managed Care – PPO | Admitting: Internal Medicine

## 2014-09-02 ENCOUNTER — Other Ambulatory Visit: Payer: Self-pay

## 2014-09-07 ENCOUNTER — Other Ambulatory Visit: Payer: Self-pay | Admitting: Family Medicine

## 2014-09-08 ENCOUNTER — Encounter: Payer: Self-pay | Admitting: Family Medicine

## 2014-10-26 ENCOUNTER — Other Ambulatory Visit: Payer: Self-pay | Admitting: Family Medicine

## 2014-10-26 DIAGNOSIS — IMO0001 Reserved for inherently not codable concepts without codable children: Secondary | ICD-10-CM

## 2014-10-26 NOTE — Telephone Encounter (Signed)
Will need a dedicated visit for chronic pain management to bring our care in compliance with new [facility] guidelines.

## 2014-10-26 NOTE — Telephone Encounter (Signed)
Needs refill on hydrocodone °Please advise  °

## 2014-11-01 ENCOUNTER — Telehealth: Payer: Self-pay | Admitting: Family Medicine

## 2014-11-01 NOTE — Telephone Encounter (Signed)
Pt called because she received a message through My Chart that he appointment was canceled. It was she did this when she received the automated phone call. She didn't realize she did that. She would still like to see Dr. Andria Frames 11/02/14 if he will fit her in. She is needing refill on pain medication. Please call patient to inform them if the can still come in. jw

## 2014-11-01 NOTE — Telephone Encounter (Signed)
Spoke with patient, scheduled her for 12/16 @ 4pm as a double book on Dr. Lowella Bandy schedule

## 2014-11-02 ENCOUNTER — Ambulatory Visit: Payer: BC Managed Care – PPO | Admitting: Family Medicine

## 2014-11-02 ENCOUNTER — Ambulatory Visit (INDEPENDENT_AMBULATORY_CARE_PROVIDER_SITE_OTHER): Payer: BC Managed Care – PPO | Admitting: Family Medicine

## 2014-11-02 VITALS — BP 128/81 | HR 104 | Temp 99.5°F | Wt 198.0 lb

## 2014-11-02 DIAGNOSIS — G8929 Other chronic pain: Secondary | ICD-10-CM | POA: Insufficient documentation

## 2014-11-02 DIAGNOSIS — M791 Myalgia: Secondary | ICD-10-CM | POA: Diagnosis not present

## 2014-11-02 DIAGNOSIS — Z7189 Other specified counseling: Secondary | ICD-10-CM

## 2014-11-02 DIAGNOSIS — M609 Myositis, unspecified: Secondary | ICD-10-CM | POA: Diagnosis not present

## 2014-11-02 DIAGNOSIS — IMO0001 Reserved for inherently not codable concepts without codable children: Secondary | ICD-10-CM

## 2014-11-02 MED ORDER — HYDROCODONE-ACETAMINOPHEN 7.5-325 MG PO TABS: ORAL_TABLET | ORAL | Status: DC

## 2014-11-02 NOTE — Assessment & Plan Note (Addendum)
Explained new chronic narcotic policy. Contract completed.

## 2014-11-03 ENCOUNTER — Encounter: Payer: Self-pay | Admitting: Family Medicine

## 2014-11-03 NOTE — Patient Instructions (Signed)
Thank you for understanding and complying with our chronic pain policy. See me in 3 months, sooner if medical problems.

## 2014-11-03 NOTE — Progress Notes (Signed)
   Subjective:    Patient ID: Barbara Miles, female    DOB: August 01, 1951, 63 y.o.   MRN: [id number]  HPI Here to refill chronic narcotics.  She is stable with chronic leg pain and some element of fibromyalgia.  She has been stable on this dose which allows her to remain active.  I checked [facility] database and all prescriptions have come from me.  Reviewed and signed contract.  Given three month RX.  Knows she will need office visits for future refills.    Review of Systems     Objective:   Physical Exam not done        Assessment & Plan:

## 2014-12-23 ENCOUNTER — Other Ambulatory Visit: Payer: Self-pay | Admitting: *Deleted

## 2014-12-23 DIAGNOSIS — I10 Essential (primary) hypertension: Secondary | ICD-10-CM

## 2014-12-26 MED ORDER — LISINOPRIL-HYDROCHLOROTHIAZIDE 20-25 MG PO TABS
1.0000 | ORAL_TABLET | Freq: Every day | ORAL | Status: DC
Start: 2014-12-26 — End: 2015-12-25

## 2015-02-01 ENCOUNTER — Encounter: Payer: Self-pay | Admitting: Family Medicine

## 2015-02-01 ENCOUNTER — Ambulatory Visit (INDEPENDENT_AMBULATORY_CARE_PROVIDER_SITE_OTHER): Payer: 59 | Admitting: Family Medicine

## 2015-02-01 VITALS — BP 118/64 | HR 88 | Ht 66.0 in | Wt 203.0 lb

## 2015-02-01 DIAGNOSIS — Z7189 Other specified counseling: Secondary | ICD-10-CM

## 2015-02-01 DIAGNOSIS — E7800 Pure hypercholesterolemia, unspecified: Secondary | ICD-10-CM

## 2015-02-01 DIAGNOSIS — I10 Essential (primary) hypertension: Secondary | ICD-10-CM

## 2015-02-01 DIAGNOSIS — Z114 Encounter for screening for human immunodeficiency virus [HIV]: Secondary | ICD-10-CM | POA: Insufficient documentation

## 2015-02-01 DIAGNOSIS — G8929 Other chronic pain: Secondary | ICD-10-CM

## 2015-02-01 DIAGNOSIS — K921 Melena: Secondary | ICD-10-CM

## 2015-02-01 LAB — LIPID PANEL
Cholesterol: 241 mg/dL — ABNORMAL HIGH (ref 0–200)
HDL: 49 mg/dL (ref >=46)
LDL Cholesterol: 163 mg/dL — ABNORMAL HIGH (ref 0–99)
TRIGLYCERIDES: 146 mg/dL (ref <150)
Total CHOL/HDL Ratio: 4.9 Ratio
VLDL: 29 mg/dL (ref 0–40)

## 2015-02-01 MED ORDER — HYDROCODONE-ACETAMINOPHEN 7.5-325 MG PO TABS: ORAL_TABLET | ORAL | Status: DC

## 2015-02-01 NOTE — Patient Instructions (Signed)
All your scripts should be 90 days.  Have pharmacy call me if problems. Next time, PAP smear - probably your last one. Get your colonoscopy done. Thank you for the gifts.

## 2015-02-02 LAB — HIV ANTIBODY (ROUTINE TESTING W REFLEX): HIV: NONREACTIVE

## 2015-02-02 MED ORDER — PRAVASTATIN SODIUM 40 MG PO TABS: 40.0000 mg | ORAL_TABLET | Freq: Every day | ORAL | Status: DC

## 2015-02-02 NOTE — Assessment & Plan Note (Signed)
Once again emphasized need for colonoscopy.  She attributes blood to hemorrhoids and I tell her she cannot make that assumption.  No grosse bleeding for months.

## 2015-02-02 NOTE — Progress Notes (Signed)
   Subjective:    Patient ID: Barbara Miles, female    DOB: 01/25/1951, 64 y.o.   MRN: [id number]  HPI Brief recheck.  Doing quite well.  No complaints.  She has begun to exercise more with spring being here.  She wants to be sure she has all ninety day prescriptions. She is primarily being seen for her 3 month narcotic refill.  The medication allows her to stay active.     Review of Systems     Objective:   Physical ExamLungs clear BP fine on recheck Cardiac RRR without m or g.        Assessment & Plan:

## 2015-02-02 NOTE — Assessment & Plan Note (Signed)
Check lipids Results show high LDL.  She is willing to try statin.  Given general medication reluctance, will start with lower potency statin in hopes of minimizing side effects and her resistance.

## 2015-02-02 NOTE — Assessment & Plan Note (Signed)
Refilled hydrocodone as usual dose.

## 2015-02-02 NOTE — Assessment & Plan Note (Signed)
Low risk, will screen.

## 2015-04-04 ENCOUNTER — Other Ambulatory Visit: Payer: Self-pay | Admitting: Family Medicine

## 2015-04-09 ENCOUNTER — Encounter: Payer: Self-pay | Admitting: Family Medicine

## 2015-04-28 ENCOUNTER — Ambulatory Visit (INDEPENDENT_AMBULATORY_CARE_PROVIDER_SITE_OTHER): Payer: 59 | Admitting: Family Medicine

## 2015-04-28 ENCOUNTER — Encounter: Payer: Self-pay | Admitting: Family Medicine

## 2015-04-28 VITALS — BP 150/78 | HR 72 | Temp 98.3°F | Ht 66.0 in | Wt 196.4 lb

## 2015-04-28 DIAGNOSIS — E7800 Pure hypercholesterolemia, unspecified: Secondary | ICD-10-CM

## 2015-04-28 DIAGNOSIS — F411 Generalized anxiety disorder: Secondary | ICD-10-CM | POA: Diagnosis not present

## 2015-04-28 DIAGNOSIS — G8929 Other chronic pain: Secondary | ICD-10-CM | POA: Insufficient documentation

## 2015-04-28 DIAGNOSIS — R1011 Right upper quadrant pain: Secondary | ICD-10-CM

## 2015-04-28 DIAGNOSIS — Z7189 Other specified counseling: Secondary | ICD-10-CM | POA: Diagnosis not present

## 2015-04-28 DIAGNOSIS — R109 Unspecified abdominal pain: Secondary | ICD-10-CM

## 2015-04-28 LAB — POCT URINALYSIS DIPSTICK
Bilirubin, UA: NEGATIVE
GLUCOSE UA: NEGATIVE
Ketones, UA: NEGATIVE
LEUKOCYTES UA: NEGATIVE
Nitrite, UA: NEGATIVE
PH UA: 5.5
RBC UA: NEGATIVE
Spec Grav, UA: 1.025
Urobilinogen, UA: 0.2

## 2015-04-28 LAB — COMPREHENSIVE METABOLIC PANEL
ALBUMIN: 4.2 g/dL (ref 3.5–5.2)
ALK PHOS: 68 U/L (ref 39–117)
ALT: 32 U/L (ref 0–35)
AST: 31 U/L (ref 0–37)
BUN: 19 mg/dL (ref 6–23)
CO2: 23 mEq/L (ref 19–32)
Calcium: 9.9 mg/dL (ref 8.4–10.5)
Chloride: 101 mEq/L (ref 96–112)
Creat: 0.89 mg/dL (ref 0.50–1.10)
GLUCOSE: 91 mg/dL (ref 70–99)
POTASSIUM: 4.1 meq/L (ref 3.5–5.3)
Sodium: 143 mEq/L (ref 135–145)
Total Bilirubin: 0.5 mg/dL (ref 0.2–1.2)
Total Protein: 7.1 g/dL (ref 6.0–8.3)

## 2015-04-28 MED ORDER — HYDROCODONE-ACETAMINOPHEN 7.5-325 MG PO TABS: ORAL_TABLET | ORAL | Status: DC

## 2015-04-28 MED ORDER — CYCLOBENZAPRINE HCL 10 MG PO TABS: ORAL_TABLET | ORAL | Status: DC

## 2015-04-28 NOTE — Patient Instructions (Signed)
I will call with blood and urine results.  I am OK with you being off the statin.  Try red yeast rice.  Thank you for the flag day stuff. OK to bump the pain medicine to three times a day. I am happy to get you off the Xanax.

## 2015-04-28 NOTE — Assessment & Plan Note (Signed)
Will check CMP - consider ultrasound if elevated transaminases or alk phos.

## 2015-04-28 NOTE — Assessment & Plan Note (Signed)
DC xanax

## 2015-04-28 NOTE — Progress Notes (Signed)
   Subjective:    Patient ID: Barbara Miles, female    DOB: 04/26/1951, 64 y.o.   MRN: [id number]  HPISeveral issues.  Wants to be off her xanax.  Feels she is coping well on her own.  Great. Wants to increase hydrocodone to 3 times per day.   Has right upper quadrent abd pain  S/P cholecystectomy.  No nausea or vomiting. Also has right flank pain which appears to be unrelated to abd pain.  Relieved by stretching.  No urinary symptoms. Also complains of pain on statin.   Review of Systems     Objective:   Physical Exam Lungs clear Abd benign.  No pain now.   No CVA tenderness.       Assessment & Plan:

## 2015-04-28 NOTE — Assessment & Plan Note (Signed)
UA neg - musculoskeletal by history.  Observe.

## 2015-04-28 NOTE — Assessment & Plan Note (Signed)
Ran CV risk - only 5.9%.  She is primary prevention.  OK to be off statin.  Cont ASA.  Try red yeast rice.

## 2015-04-28 NOTE — Assessment & Plan Note (Signed)
I am OK with the hydrocodone increase, especially since I am stopping benzo.

## 2015-05-01 ENCOUNTER — Encounter: Payer: Self-pay | Admitting: Family Medicine

## 2015-05-01 ENCOUNTER — Telehealth: Payer: Self-pay | Admitting: *Deleted

## 2015-05-01 DIAGNOSIS — G8929 Other chronic pain: Secondary | ICD-10-CM

## 2015-05-01 MED ORDER — HYDROCODONE-ACETAMINOPHEN 7.5-325 MG PO TABS: ORAL_TABLET | ORAL | Status: DC

## 2015-05-01 MED ORDER — HYDROCODONE-ACETAMINOPHEN 7.5-325 MG PO TABS: 1.0000 | ORAL_TABLET | Freq: Three times a day (TID) | ORAL | Status: DC | PRN

## 2015-05-01 NOTE — Telephone Encounter (Signed)
Spoke with patient.  She will pick up the two printed prescriptions.

## 2015-05-01 NOTE — Telephone Encounter (Signed)
Dorothea Ogle, Pharmacist from [facility] stating they only filled a 30 day supply #90 of patient's Rx Norco.  Pt's insurance would only pay for a 30 day supply. Pt had the option to pay out of pocket for the rest of the medication, but declined.  The other half of the medication for voided.  Pt told to check with PCP to write for two more Rx for Norco for 30 day supply.  Please advise.  Derl Barrow, RN

## 2015-07-07 ENCOUNTER — Telehealth: Payer: Self-pay | Admitting: Family Medicine

## 2015-07-07 DIAGNOSIS — G8929 Other chronic pain: Secondary | ICD-10-CM

## 2015-07-07 NOTE — Telephone Encounter (Signed)
Barbara Miles is seeing Dr. Nori Riis on 9/* for her pap.  Need to have her rx for her hydrocodone ready for pickup on that day.

## 2015-07-10 MED ORDER — HYDROCODONE-ACETAMINOPHEN 7.5-325 MG PO TABS: ORAL_TABLET | ORAL | Status: DC

## 2015-07-10 NOTE — Telephone Encounter (Signed)
Done and patient informed.

## 2015-07-25 ENCOUNTER — Telehealth: Payer: Self-pay | Admitting: Family Medicine

## 2015-07-25 DIAGNOSIS — G8929 Other chronic pain: Secondary | ICD-10-CM

## 2015-07-25 NOTE — Telephone Encounter (Signed)
Barbara Miles had to cancel her appt for this Thurs due to a family matter.  Want to still receive her rx for her medication.   Please contact patient to discuss.

## 2015-07-26 MED ORDER — HYDROCODONE-ACETAMINOPHEN 7.5-325 MG PO TABS: ORAL_TABLET | ORAL | Status: DC

## 2015-07-26 NOTE — Telephone Encounter (Signed)
Done

## 2015-07-27 ENCOUNTER — Ambulatory Visit: Payer: 59

## 2015-08-21 ENCOUNTER — Telehealth: Payer: Self-pay | Admitting: Family Medicine

## 2015-08-21 DIAGNOSIS — G8929 Other chronic pain: Secondary | ICD-10-CM

## 2015-08-21 MED ORDER — HYDROCODONE-ACETAMINOPHEN 7.5-325 MG PO TABS: 1.0000 | ORAL_TABLET | Freq: Three times a day (TID) | ORAL | Status: DC | PRN

## 2015-08-21 NOTE — Telephone Encounter (Signed)
Please leave rx for patient's hydrocodone up front tomorrow for she will be picking it up when come in for an appt for SDA.  Can date it for the 10/10.

## 2015-08-21 NOTE — Telephone Encounter (Signed)
Done

## 2015-08-22 ENCOUNTER — Encounter: Payer: Self-pay | Admitting: Family Medicine

## 2015-08-22 ENCOUNTER — Ambulatory Visit (INDEPENDENT_AMBULATORY_CARE_PROVIDER_SITE_OTHER): Payer: 59 | Admitting: Family Medicine

## 2015-08-22 VITALS — BP 142/54 | HR 78 | Temp 98.6°F | Ht 66.0 in | Wt 197.6 lb

## 2015-08-22 DIAGNOSIS — R21 Rash and other nonspecific skin eruption: Secondary | ICD-10-CM

## 2015-08-22 NOTE — Patient Instructions (Signed)
Thank you so much for coming to visit me today! Please continue to use Coconut Lotion or Vaseline on your skin in dry areas. I suspect you might have very mild Eczema. If this does not improve in the next two weeks, please return.  Thanks again and thank you so much for the candy...you made my day! Dr. Gerlean Ren  Eczema Eczema, also called atopic dermatitis, is a skin disorder that causes inflammation of the skin. It causes a red rash and dry, scaly skin. The skin becomes very itchy. Eczema is generally worse during the cooler winter months and often improves with the warmth of summer. Eczema usually starts showing signs in infancy. Some children outgrow eczema, but it may last through adulthood.  CAUSES  The exact cause of eczema is not known, but it appears to run in families. People with eczema often have a family history of eczema, allergies, asthma, or hay fever. Eczema is not contagious. Flare-ups of the condition may be caused by:   Contact with something you are sensitive or allergic to.   Stress. SIGNS AND SYMPTOMS  Dry, scaly skin.   Red, itchy rash.   Itchiness. This may occur before the skin rash and may be very intense.  DIAGNOSIS  The diagnosis of eczema is usually made based on symptoms and medical history. TREATMENT  Eczema cannot be cured, but symptoms usually can be controlled with treatment and other strategies. A treatment plan might include:  Controlling the itching and scratching.   Use over-the-counter antihistamines as directed for itching. This is especially useful at night when the itching tends to be worse.   Use over-the-counter steroid creams as directed for itching.   Avoid scratching. Scratching makes the rash and itching worse. It may also result in a skin infection (impetigo) due to a break in the skin caused by scratching.   Keeping the skin well moisturized with creams every day. This will seal in moisture and help prevent dryness. Lotions  that contain alcohol and water should be avoided because they can dry the skin.   Limiting exposure to things that you are sensitive or allergic to (allergens).   Recognizing situations that cause stress.   Developing a plan to manage stress.  HOME CARE INSTRUCTIONS   Only take over-the-counter or prescription medicines as directed by your health care provider.   Do not use anything on the skin without checking with your health care provider.   Keep baths or showers short (5 minutes) in warm (not hot) water. Use mild cleansers for bathing. These should be unscented. You may add nonperfumed bath oil to the bath water. It is best to avoid soap and bubble bath.   Immediately after a bath or shower, when the skin is still damp, apply a moisturizing ointment to the entire body. This ointment should be a petroleum ointment. This will seal in moisture and help prevent dryness. The thicker the ointment, the better. These should be unscented.   Keep fingernails cut short. Children with eczema may need to wear soft gloves or mittens at night after applying an ointment.   Dress in clothes made of cotton or cotton blends. Dress lightly, because heat increases itching.   A child with eczema should stay away from anyone with fever blisters or cold sores. The virus that causes fever blisters (herpes simplex) can cause a serious skin infection in children with eczema. SEEK MEDICAL CARE IF:   Your itching interferes with sleep.   Your rash gets worse or  is not better within 1 week after starting treatment.   You see pus or soft yellow scabs in the rash area.   You have a fever.   You have a rash flare-up after contact with someone who has fever blisters.  Document Released: 11/01/2000 Document Revised: 08/25/2013 Document Reviewed: 06/07/2013 [employer] Patient Information 2015 [place], [place]. This information is not intended to replace advice given to you by your health care provider.  Make sure you discuss any questions you have with your health care provider.

## 2015-08-27 DIAGNOSIS — R21 Rash and other nonspecific skin eruption: Secondary | ICD-10-CM | POA: Insufficient documentation

## 2015-08-27 NOTE — Progress Notes (Signed)
Subjective:     Patient ID: Barbara Miles, female   DOB: [birth year], 64 y.o.   MRN: [id number]  HPI Barbara Miles is a 64yo female presenting with rash above right eye. - States rash occurred one week ago and is now resolved - States family history of psoriasis and eczema. Denies being diagnosed with either of these. - Rash was itchy and dry. Denies any pain. - Has had similar rashes in past - Usually uses coconut lotion and it resolves. Used this for this rash as well and it has resolved. - Denies rashes elsewhere. - No further concerns  Review of Systems Per HPI    Objective:   Physical Exam  Constitutional: She appears well-developed and well-nourished. No distress.  Cardiovascular: Normal rate and regular rhythm.  Exam reveals no gallop and no friction rub.   No murmur heard. Pulmonary/Chest: Effort normal. No respiratory distress. She has no wheezes. She has no rales.  Skin: No rash noted. No erythema.       Assessment and Plan:     Rash and nonspecific skin eruption - Resolved - Continue to use Vaseline or Coconut Lotion as needed - Follow up if needed

## 2015-08-27 NOTE — Assessment & Plan Note (Signed)
-   Resolved - Continue to use Vaseline or Coconut Lotion as needed - Follow up if needed

## 2015-09-27 ENCOUNTER — Telehealth: Payer: Self-pay | Admitting: Family Medicine

## 2015-09-27 DIAGNOSIS — G8929 Other chronic pain: Secondary | ICD-10-CM

## 2015-09-27 MED ORDER — HYDROCODONE-ACETAMINOPHEN 7.5-325 MG PO TABS: 1.0000 | ORAL_TABLET | Freq: Three times a day (TID) | ORAL | Status: DC | PRN

## 2015-09-27 NOTE — Telephone Encounter (Signed)
Rx for hydrocodone needs to refilled.  No appts available of Dec 9 as pt requested. Woulk like to come pick up

## 2015-09-27 NOTE — Telephone Encounter (Signed)
Done

## 2015-10-24 ENCOUNTER — Other Ambulatory Visit: Payer: Self-pay | Admitting: Family Medicine

## 2015-10-24 DIAGNOSIS — G8929 Other chronic pain: Secondary | ICD-10-CM

## 2015-10-24 MED ORDER — HYDROCODONE-ACETAMINOPHEN 7.5-325 MG PO TABS: 1.0000 | ORAL_TABLET | Freq: Three times a day (TID) | ORAL | Status: DC | PRN

## 2015-10-24 NOTE — Telephone Encounter (Signed)
approved

## 2015-10-24 NOTE — Telephone Encounter (Signed)
Would like hydrocodone RX leff up front on Thursday because of the bad weather  Predicted for Friday

## 2015-11-22 ENCOUNTER — Telehealth: Payer: Self-pay | Admitting: Family Medicine

## 2015-11-22 DIAGNOSIS — G8929 Other chronic pain: Secondary | ICD-10-CM

## 2015-11-22 MED ORDER — HYDROCODONE-ACETAMINOPHEN 7.5-325 MG PO TABS: 1.0000 | ORAL_TABLET | Freq: Three times a day (TID) | ORAL | Status: DC | PRN

## 2015-11-22 NOTE — Telephone Encounter (Signed)
Pt called and would like to know if the doctor can leave her Hydrocodone prescription up front for pick up. Please let patient know when ready. jw

## 2015-11-22 NOTE — Telephone Encounter (Signed)
Patient notified that Rx is ready.

## 2015-12-25 ENCOUNTER — Other Ambulatory Visit: Payer: Self-pay | Admitting: Family Medicine

## 2015-12-28 ENCOUNTER — Encounter: Payer: Self-pay | Admitting: Family Medicine

## 2015-12-28 ENCOUNTER — Ambulatory Visit (INDEPENDENT_AMBULATORY_CARE_PROVIDER_SITE_OTHER): Payer: BLUE CROSS/BLUE SHIELD | Admitting: Family Medicine

## 2015-12-28 VITALS — BP 146/54 | HR 77 | Temp 97.9°F | Ht 66.0 in | Wt 189.2 lb

## 2015-12-28 DIAGNOSIS — Z7189 Other specified counseling: Secondary | ICD-10-CM

## 2015-12-28 DIAGNOSIS — M791 Myalgia: Secondary | ICD-10-CM

## 2015-12-28 DIAGNOSIS — G8929 Other chronic pain: Secondary | ICD-10-CM

## 2015-12-28 DIAGNOSIS — I739 Peripheral vascular disease, unspecified: Secondary | ICD-10-CM | POA: Diagnosis not present

## 2015-12-28 DIAGNOSIS — M609 Myositis, unspecified: Secondary | ICD-10-CM

## 2015-12-28 DIAGNOSIS — I10 Essential (primary) hypertension: Secondary | ICD-10-CM | POA: Diagnosis not present

## 2015-12-28 DIAGNOSIS — IMO0001 Reserved for inherently not codable concepts without codable children: Secondary | ICD-10-CM

## 2015-12-28 MED ORDER — HYDROCODONE-ACETAMINOPHEN 7.5-325 MG PO TABS: 1.0000 | ORAL_TABLET | Freq: Three times a day (TID) | ORAL | Status: DC

## 2015-12-28 MED ORDER — HYDROCODONE-ACETAMINOPHEN 7.5-325 MG PO TABS: ORAL_TABLET | ORAL | Status: DC

## 2015-12-28 MED ORDER — HYDROCODONE-ACETAMINOPHEN 7.5-325 MG PO TABS: 1.0000 | ORAL_TABLET | Freq: Three times a day (TID) | ORAL | Status: DC | PRN

## 2015-12-28 NOTE — Patient Instructions (Signed)
See me in three months for a PAP smear.

## 2015-12-29 NOTE — Assessment & Plan Note (Signed)
Refilled pain meds.  Continue current Rx
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
## 2015-12-29 NOTE — Assessment & Plan Note (Signed)
Stable, no skin breakdown.  Continue daily aspirin.

## 2015-12-29 NOTE — Assessment & Plan Note (Signed)
Borderline elevation.  Consider up meds if elevated next visit.  Continue home monitor.

## 2016-03-21 ENCOUNTER — Ambulatory Visit (INDEPENDENT_AMBULATORY_CARE_PROVIDER_SITE_OTHER): Payer: BLUE CROSS/BLUE SHIELD | Admitting: Internal Medicine

## 2016-03-21 ENCOUNTER — Other Ambulatory Visit (HOSPITAL_COMMUNITY)
Admission: RE | Admit: 2016-03-21 | Discharge: 2016-03-21 | Disposition: A | Discharge status: Home / Self Care | Payer: BLUE CROSS/BLUE SHIELD | Source: Ambulatory Visit | Attending: Family Medicine | Admitting: Family Medicine

## 2016-03-21 ENCOUNTER — Encounter: Payer: Self-pay | Admitting: Internal Medicine

## 2016-03-21 VITALS — BP 153/66 | HR 72 | Wt 188.3 lb

## 2016-03-21 DIAGNOSIS — Z124 Encounter for screening for malignant neoplasm of cervix: Secondary | ICD-10-CM | POA: Diagnosis not present

## 2016-03-21 DIAGNOSIS — Z7189 Other specified counseling: Secondary | ICD-10-CM | POA: Diagnosis not present

## 2016-03-21 DIAGNOSIS — Z01419 Encounter for gynecological examination (general) (routine) without abnormal findings: Secondary | ICD-10-CM | POA: Insufficient documentation

## 2016-03-21 DIAGNOSIS — G8929 Other chronic pain: Secondary | ICD-10-CM

## 2016-03-21 DIAGNOSIS — Z1151 Encounter for screening for human papillomavirus (HPV): Secondary | ICD-10-CM | POA: Insufficient documentation

## 2016-03-21 MED ORDER — HYDROCODONE-ACETAMINOPHEN 7.5-325 MG PO TABS: ORAL_TABLET | ORAL | Status: DC

## 2016-03-21 MED ORDER — HYDROCODONE-ACETAMINOPHEN 7.5-325 MG PO TABS: 1.0000 | ORAL_TABLET | Freq: Three times a day (TID) | ORAL | Status: DC | PRN

## 2016-03-21 MED ORDER — HYDROCODONE-ACETAMINOPHEN 7.5-325 MG PO TABS: 1.0000 | ORAL_TABLET | Freq: Three times a day (TID) | ORAL | Status: DC
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
## 2016-03-21 NOTE — Patient Instructions (Signed)
Mrs. Lydick,  Thank you for waiting today!  I will call you with the results of your pap smear.  Using compression stockings, elevating your feet, and keeping blood pressure controlled can help with varicose veins.  Best, Dr. Ola Spurr

## 2016-03-22 LAB — CYTOLOGY - PAP

## 2016-03-25 ENCOUNTER — Telehealth: Payer: Self-pay | Admitting: Internal Medicine

## 2016-03-25 NOTE — Telephone Encounter (Signed)
Notified patient of normal pap smear results.

## 2016-04-24 ENCOUNTER — Other Ambulatory Visit: Payer: Self-pay | Admitting: Family Medicine

## 2016-06-20 ENCOUNTER — Encounter: Payer: Self-pay | Admitting: Family Medicine

## 2016-06-20 ENCOUNTER — Ambulatory Visit (INDEPENDENT_AMBULATORY_CARE_PROVIDER_SITE_OTHER): Payer: BLUE CROSS/BLUE SHIELD | Admitting: Family Medicine

## 2016-06-20 DIAGNOSIS — Z7189 Other specified counseling: Secondary | ICD-10-CM

## 2016-06-20 DIAGNOSIS — G8929 Other chronic pain: Secondary | ICD-10-CM

## 2016-06-20 MED ORDER — HYDROCODONE-ACETAMINOPHEN 7.5-325 MG PO TABS: 1.0000 | ORAL_TABLET | Freq: Three times a day (TID) | ORAL | 0 refills | Status: DC

## 2016-06-20 MED ORDER — HYDROCODONE-ACETAMINOPHEN 7.5-325 MG PO TABS: ORAL_TABLET | ORAL | 0 refills | Status: DC

## 2016-06-20 MED ORDER — HYDROCODONE-ACETAMINOPHEN 7.5-325 MG PO TABS: 1.0000 | ORAL_TABLET | Freq: Three times a day (TID) | ORAL | 0 refills | Status: DC | PRN

## 2016-06-20 NOTE — Patient Instructions (Signed)
Let me know when you get your flu shot. Please get a mammogram Talk with your insurance company about whether they cover ColoGuard testing. See me in November. Use claritin and a steroid cream for your itchy rash.

## 2016-06-21 NOTE — Progress Notes (Signed)
   Subjective:    Patient ID: Barbara Miles, female    DOB: 04-17-1951, 65 y.o.   MRN: [id number]  HPI Several issues: 1. Chronic pain med refill.  No request for escalation.  Meds keep her active.  I plan to continue 2. Due for mammo.  States she will get 3. Due for colon cancer screen.  Reluctant to have colonoscopy.  Would consider cologuard. 4. 24 hour hx of itiching and redness of both arms.  No unusual contact or exposure.    Review of Systems     Objective:   Physical Exam  Mild erythema of both arms with a little excoriation.  No discrete lesions.        Assessment & Plan:

## 2016-06-21 NOTE — Assessment & Plan Note (Signed)
Refilled

## 2016-08-22 ENCOUNTER — Telehealth: Payer: Self-pay | Admitting: *Deleted

## 2016-08-22 DIAGNOSIS — Z1211 Encounter for screening for malignant neoplasm of colon: Secondary | ICD-10-CM

## 2016-08-22 NOTE — Addendum Note (Signed)
Addended by: Zenia Resides on: 08/22/2016 03:46 PM   Modules accepted: Orders

## 2016-08-22 NOTE — Telephone Encounter (Signed)
Patient called to report that she got her flu vaccine today at [facility].  She also wanted her PCP know that the cologuard will be pain at 100% from [facility].  Derl Barrow, RN

## 2016-09-23 ENCOUNTER — Other Ambulatory Visit: Payer: Self-pay | Admitting: Family Medicine

## 2016-09-23 DIAGNOSIS — G8929 Other chronic pain: Secondary | ICD-10-CM

## 2016-09-23 MED ORDER — HYDROCODONE-ACETAMINOPHEN 7.5-325 MG PO TABS: 1.0000 | ORAL_TABLET | Freq: Three times a day (TID) | ORAL | 0 refills | Status: DC | PRN

## 2016-09-23 NOTE — Telephone Encounter (Signed)
Will fill this.  Called and told needs face to face visit q3 months moving forward.

## 2016-09-23 NOTE — Telephone Encounter (Signed)
Pt would like to have a refill on hydrocodone ready by Thursday morning for pick up,so husband can pick it up when he comes into town. Please advise. Thanks! ep

## 2016-10-24 ENCOUNTER — Ambulatory Visit (INDEPENDENT_AMBULATORY_CARE_PROVIDER_SITE_OTHER): Payer: BLUE CROSS/BLUE SHIELD | Admitting: Family Medicine

## 2016-10-24 ENCOUNTER — Encounter: Payer: Self-pay | Admitting: Family Medicine

## 2016-10-24 DIAGNOSIS — G8929 Other chronic pain: Secondary | ICD-10-CM

## 2016-10-24 DIAGNOSIS — Z1211 Encounter for screening for malignant neoplasm of colon: Secondary | ICD-10-CM

## 2016-10-24 MED ORDER — HYDROCODONE-ACETAMINOPHEN 7.5-325 MG PO TABS: 1.0000 | ORAL_TABLET | Freq: Three times a day (TID) | ORAL | 0 refills | Status: DC

## 2016-10-24 MED ORDER — HYDROCODONE-ACETAMINOPHEN 7.5-325 MG PO TABS: ORAL_TABLET | ORAL | 0 refills | Status: DC

## 2016-10-24 MED ORDER — HYDROCODONE-ACETAMINOPHEN 7.5-325 MG PO TABS: 1.0000 | ORAL_TABLET | Freq: Three times a day (TID) | ORAL | 0 refills | Status: DC | PRN

## 2016-10-24 NOTE — Progress Notes (Signed)
   Subjective:    Patient ID: Barbara Miles, female    DOB: [birth year], 65 y.o.   MRN: [id number]  HPI Mainly here for a refill of her chronic pain meds.  Doing well on current dose, which allows her to remain active.   Also Due for mammo.  She knows and will schedule.  Wants to wait until Jan for financial reasons. Never colonoscopy.  Wants to have cologuard for colon cancer screen.      Review of Systems     Objective:   Physical Exam Lungs clear Cardiac RRR without m or g         Assessment & Plan:

## 2016-10-24 NOTE — Assessment & Plan Note (Signed)
Continue current Rx.

## 2016-10-24 NOTE — Assessment & Plan Note (Signed)
Ordered cologuard.  I am unsure of the mechanics of how she gets the kit.

## 2016-10-24 NOTE — Patient Instructions (Signed)
Make sure you get your mammogram in January. I ordered the cologuard again.  Call in two weeks if you haven't heard anything. See me in three months.

## 2016-11-19 ENCOUNTER — Other Ambulatory Visit: Payer: Self-pay | Admitting: Family Medicine

## 2016-11-19 DIAGNOSIS — Z1231 Encounter for screening mammogram for malignant neoplasm of breast: Secondary | ICD-10-CM

## 2016-11-26 ENCOUNTER — Ambulatory Visit: Payer: BLUE CROSS/BLUE SHIELD

## 2016-11-28 ENCOUNTER — Ambulatory Visit
Admission: RE | Admit: 2016-11-28 | Discharge: 2016-11-28 | Disposition: A | Discharge status: Home / Self Care | Payer: BLUE CROSS/BLUE SHIELD | Source: Ambulatory Visit | Attending: Family Medicine | Admitting: Family Medicine

## 2016-11-28 DIAGNOSIS — Z1231 Encounter for screening mammogram for malignant neoplasm of breast: Secondary | ICD-10-CM

## 2016-12-04 ENCOUNTER — Encounter: Payer: Self-pay | Admitting: Family Medicine

## 2016-12-06 ENCOUNTER — Telehealth: Payer: Self-pay | Admitting: Family Medicine

## 2016-12-06 DIAGNOSIS — R195 Other fecal abnormalities: Secondary | ICD-10-CM | POA: Insufficient documentation

## 2016-12-06 NOTE — Telephone Encounter (Signed)
Called patient and told cologuard is positive meaning she definitely needs follow up colonoscopy.  Order entered.

## 2016-12-09 ENCOUNTER — Encounter: Payer: Self-pay | Admitting: Internal Medicine

## 2016-12-16 ENCOUNTER — Telehealth: Payer: Self-pay | Admitting: Family Medicine

## 2016-12-16 MED ORDER — FLUCONAZOLE 150 MG PO TABS: 150.0000 mg | ORAL_TABLET | Freq: Once | ORAL | 0 refills | Status: AC

## 2016-12-16 NOTE — Telephone Encounter (Signed)
Done and patient informed.

## 2016-12-16 NOTE — Telephone Encounter (Signed)
Pt called because she is having dental surgery and that doctor put her on amoxicillin. She now has a yeast infection and would like the pill called in so that she can get rid of this. jw

## 2016-12-24 ENCOUNTER — Encounter (HOSPITAL_COMMUNITY): Payer: Self-pay | Admitting: Emergency Medicine

## 2016-12-24 ENCOUNTER — Emergency Department (HOSPITAL_COMMUNITY)
Admission: EM | Admit: 2016-12-24 | Discharge: 2016-12-24 | Disposition: A | Discharge status: Home / Self Care | Payer: BLUE CROSS/BLUE SHIELD | Attending: Emergency Medicine | Admitting: Emergency Medicine

## 2016-12-24 ENCOUNTER — Emergency Department (HOSPITAL_COMMUNITY): Payer: BLUE CROSS/BLUE SHIELD

## 2016-12-24 DIAGNOSIS — Z87891 Personal history of nicotine dependence: Secondary | ICD-10-CM | POA: Insufficient documentation

## 2016-12-24 DIAGNOSIS — K5900 Constipation, unspecified: Secondary | ICD-10-CM

## 2016-12-24 DIAGNOSIS — R1032 Left lower quadrant pain: Secondary | ICD-10-CM | POA: Diagnosis present

## 2016-12-24 DIAGNOSIS — I10 Essential (primary) hypertension: Secondary | ICD-10-CM | POA: Insufficient documentation

## 2016-12-24 DIAGNOSIS — Z7982 Long term (current) use of aspirin: Secondary | ICD-10-CM | POA: Diagnosis not present

## 2016-12-24 DIAGNOSIS — J449 Chronic obstructive pulmonary disease, unspecified: Secondary | ICD-10-CM | POA: Insufficient documentation

## 2016-12-24 DIAGNOSIS — Z79899 Other long term (current) drug therapy: Secondary | ICD-10-CM | POA: Insufficient documentation

## 2016-12-24 DIAGNOSIS — K5792 Diverticulitis of intestine, part unspecified, without perforation or abscess without bleeding: Secondary | ICD-10-CM | POA: Diagnosis not present

## 2016-12-24 LAB — URINALYSIS, ROUTINE W REFLEX MICROSCOPIC
BILIRUBIN URINE: NEGATIVE
GLUCOSE, UA: NEGATIVE mg/dL
HGB URINE DIPSTICK: NEGATIVE
KETONES UR: NEGATIVE mg/dL
Leukocytes, UA: NEGATIVE
Nitrite: NEGATIVE
PROTEIN: NEGATIVE mg/dL
Specific Gravity, Urine: 1.015 (ref 1.005–1.030)
pH: 8 (ref 5.0–8.0)

## 2016-12-24 LAB — CBC
HCT: 40.6 % (ref 36.0–46.0)
Hemoglobin: 14 g/dL (ref 12.0–15.0)
MCH: 28.7 pg (ref 26.0–34.0)
MCHC: 34.5 g/dL (ref 30.0–36.0)
MCV: 83.2 fL (ref 78.0–100.0)
PLATELETS: 233 10*3/uL (ref 150–400)
RBC: 4.88 MIL/uL (ref 3.87–5.11)
RDW: 13.7 % (ref 11.5–15.5)
WBC: 11.7 10*3/uL — ABNORMAL HIGH (ref 4.0–10.5)

## 2016-12-24 LAB — COMPREHENSIVE METABOLIC PANEL
ALK PHOS: 70 U/L (ref 38–126)
ALT: 25 U/L (ref 14–54)
AST: 26 U/L (ref 15–41)
Albumin: 3.7 g/dL (ref 3.5–5.0)
Anion gap: 10 (ref 5–15)
BUN: 18 mg/dL (ref 6–20)
CALCIUM: 9.7 mg/dL (ref 8.9–10.3)
CO2: 29 mmol/L (ref 22–32)
CREATININE: 0.8 mg/dL (ref 0.44–1.00)
Chloride: 99 mmol/L — ABNORMAL LOW (ref 101–111)
Glucose, Bld: 114 mg/dL — ABNORMAL HIGH (ref 65–99)
Potassium: 4 mmol/L (ref 3.5–5.1)
SODIUM: 138 mmol/L (ref 135–145)
Total Bilirubin: 0.5 mg/dL (ref 0.3–1.2)
Total Protein: 7.4 g/dL (ref 6.5–8.1)

## 2016-12-24 LAB — LIPASE, BLOOD: LIPASE: 17 U/L (ref 11–51)

## 2016-12-24 MED ORDER — IOPAMIDOL (ISOVUE-300) INJECTION 61%
100.0000 mL | Freq: Once | INTRAVENOUS | Status: AC | PRN
  Administered 2016-12-24: 100 mL via INTRAVENOUS

## 2016-12-24 MED ORDER — METRONIDAZOLE 500 MG PO TABS: 500.0000 mg | ORAL_TABLET | Freq: Two times a day (BID) | ORAL | 0 refills | Status: DC

## 2016-12-24 MED ORDER — ONDANSETRON HCL 4 MG/2ML IJ SOLN
4.0000 mg | Freq: Once | INTRAMUSCULAR | Status: AC
  Administered 2016-12-24: 4 mg via INTRAVENOUS
  Filled 2016-12-24: qty 2

## 2016-12-24 MED ORDER — CIPROFLOXACIN HCL 500 MG PO TABS: 500.0000 mg | ORAL_TABLET | Freq: Two times a day (BID) | ORAL | 0 refills | Status: DC

## 2016-12-24 MED ORDER — MORPHINE SULFATE (PF) 4 MG/ML IV SOLN
4.0000 mg | Freq: Once | INTRAVENOUS | Status: AC
Start: 2016-12-24 — End: 2016-12-24
  Administered 2016-12-24: 4 mg via INTRAVENOUS
  Filled 2016-12-24: qty 1

## 2016-12-24 MED ORDER — IOPAMIDOL (ISOVUE-300) INJECTION 61%
INTRAVENOUS | Status: AC
  Filled 2016-12-24: qty 30

## 2016-12-24 NOTE — ED Triage Notes (Signed)
Patient complains of lower abdominal pain and cramping. States no bowel movement since last Tuesday.

## 2016-12-24 NOTE — Discharge Instructions (Signed)
You can take OTC Miralax, one heaping capful mixed in 8-10 oz of water, juice or tea daily until constipation relief.  Follow-up with your doctor for recheck or return here for any worsening symptoms such as fever, vomiting, bloody stools or increasing pain
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
## 2016-12-24 NOTE — ED Provider Notes (Signed)
   Patient signed out to me by Evalee Jefferson, PA-C at end of shift.  CT A/P shows early diverticulitis w/o evidence of perforation or abscess.  I will treat with Cipro and Flagyl.  Pt has appt in March with [facility] for colonoscopy.  Also has PCP.  She appears stable for d/c.  Agrees to tx plan.  Return precautions given.   24  Pt also seen by Dr. Lacinda Axon and care plan discussed.    Kem Parkinson, PA-C 12/24/16 1842    Nat Christen, MD 12/26/16 [phone]

## 2017-01-07 ENCOUNTER — Other Ambulatory Visit: Payer: Self-pay | Admitting: Family Medicine

## 2017-01-22 ENCOUNTER — Encounter: Payer: Self-pay | Admitting: Family Medicine

## 2017-01-22 ENCOUNTER — Ambulatory Visit (INDEPENDENT_AMBULATORY_CARE_PROVIDER_SITE_OTHER): Payer: BLUE CROSS/BLUE SHIELD | Admitting: Family Medicine

## 2017-01-22 DIAGNOSIS — K921 Melena: Secondary | ICD-10-CM | POA: Diagnosis not present

## 2017-01-22 DIAGNOSIS — K029 Dental caries, unspecified: Secondary | ICD-10-CM

## 2017-01-22 DIAGNOSIS — G8929 Other chronic pain: Secondary | ICD-10-CM

## 2017-01-22 DIAGNOSIS — K5732 Diverticulitis of large intestine without perforation or abscess without bleeding: Secondary | ICD-10-CM

## 2017-01-22 HISTORY — DX: Diverticulitis of large intestine without perforation or abscess without bleeding: K57.32

## 2017-01-22 MED ORDER — HYDROCODONE-ACETAMINOPHEN 7.5-325 MG PO TABS: ORAL_TABLET | ORAL | 0 refills | Status: DC

## 2017-01-22 MED ORDER — HYDROCODONE-ACETAMINOPHEN 7.5-325 MG PO TABS: 1.0000 | ORAL_TABLET | Freq: Three times a day (TID) | ORAL | 0 refills | Status: DC

## 2017-01-22 MED ORDER — PENICILLIN V POTASSIUM 500 MG PO TABS: 500.0000 mg | ORAL_TABLET | Freq: Four times a day (QID) | ORAL | 0 refills | Status: DC

## 2017-01-22 MED ORDER — HYDROCODONE-ACETAMINOPHEN 7.5-325 MG PO TABS: 1.0000 | ORAL_TABLET | Freq: Three times a day (TID) | ORAL | 0 refills | Status: DC | PRN

## 2017-01-22 NOTE — Progress Notes (Signed)
   Subjective:    Patient ID: Barbara Miles, female    DOB: 1951-06-09, 66 y.o.   MRN: [id number]  HPI Four issues: 1. FU ER diverticulitis.  Reviewed visit - mild, early diverticulitis on CT.  She feels back to normal. 2. Cologuard positve colon cancer screen.  Has GI appointment planned for colonoscopy in ~ 1 month. 3. Chronic pain.  Refill narcotics.  No red flags.  Daily Morphine equivalent dose is 22.5 mg.  Staying active. 4. Dental caries.  Was on antibiotics for abscess.  Dental appointment delayed until after colonoscopy.  Now pain controled by advil.      Review of Systems     Objective:   Physical Exam VS noted Abd benign.        Assessment & Plan:
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
## 2017-01-22 NOTE — Patient Instructions (Signed)
Take the stool softener/laxative to keep things moving. I will give you your pain med scripts. I will also give you a script for penicillin - only fill it if you have worsening dental pain, not treated by advil.

## 2017-01-22 NOTE — Assessment & Plan Note (Signed)
Needs colonoscopy due to positive cologuard.

## 2017-01-22 NOTE — Assessment & Plan Note (Signed)
Given Rx for Pen VK to use only if dental abscess flairs prior to definitive dental work.

## 2017-01-24 ENCOUNTER — Ambulatory Visit (AMBULATORY_SURGERY_CENTER): Payer: Self-pay

## 2017-01-24 ENCOUNTER — Telehealth: Payer: Self-pay

## 2017-01-24 VITALS — Ht 64.25 in | Wt 190.0 lb

## 2017-01-24 DIAGNOSIS — Z1211 Encounter for screening for malignant neoplasm of colon: Secondary | ICD-10-CM

## 2017-01-24 NOTE — Telephone Encounter (Signed)
Please be advised pt in ED with diverticulitis in February. Thank you, Angela/PV

## 2017-01-24 NOTE — Telephone Encounter (Signed)
As long as she feels recovered ok for colonoscopy

## 2017-01-24 NOTE — Progress Notes (Signed)
No allergies to eggs or soy No diet meds No home oxygen No past problems with anesthesia  Registered for emmi 

## 2017-01-27 ENCOUNTER — Encounter: Payer: Self-pay | Admitting: Internal Medicine

## 2017-02-03 ENCOUNTER — Other Ambulatory Visit: Payer: Self-pay | Admitting: Family Medicine

## 2017-02-03 MED ORDER — FLUCONAZOLE 100 MG PO TABS: 150.0000 mg | ORAL_TABLET | Freq: Every day | ORAL | 0 refills | Status: AC | PRN

## 2017-02-03 NOTE — Telephone Encounter (Signed)
Pt was given antibiotics the last time she saw Dr. Andria Frames and was told to take them if she had an abscess tooth, well it has flared up again, so pt is going to start taking the antibiotics today. Pt will need Diflucan called into [place]. ep

## 2017-02-07 ENCOUNTER — Encounter: Payer: Self-pay | Admitting: Internal Medicine

## 2017-02-07 ENCOUNTER — Ambulatory Visit (AMBULATORY_SURGERY_CENTER): Payer: BLUE CROSS/BLUE SHIELD | Admitting: Internal Medicine

## 2017-02-07 VITALS — BP 121/65 | HR 61 | Temp 98.7°F | Resp 20 | Ht 64.25 in | Wt 190.0 lb

## 2017-02-07 DIAGNOSIS — Z1212 Encounter for screening for malignant neoplasm of rectum: Secondary | ICD-10-CM

## 2017-02-07 DIAGNOSIS — Z1211 Encounter for screening for malignant neoplasm of colon: Secondary | ICD-10-CM

## 2017-02-07 DIAGNOSIS — D124 Benign neoplasm of descending colon: Secondary | ICD-10-CM | POA: Diagnosis not present

## 2017-02-07 DIAGNOSIS — D126 Benign neoplasm of colon, unspecified: Secondary | ICD-10-CM | POA: Diagnosis not present

## 2017-02-07 DIAGNOSIS — D123 Benign neoplasm of transverse colon: Secondary | ICD-10-CM | POA: Diagnosis not present

## 2017-02-07 DIAGNOSIS — K635 Polyp of colon: Secondary | ICD-10-CM | POA: Diagnosis not present

## 2017-02-07 MED ORDER — SODIUM CHLORIDE 0.9 % IV SOLN: 500.0000 mL | INTRAVENOUS | Status: DC

## 2017-02-07 NOTE — Patient Instructions (Addendum)
   I found and removed 2 small polyps. You also have a condition called diverticulosis - common and not usually a problem. Please read the handout provided.  I will let you know pathology results and when to have another routine colonoscopy by mail.  I appreciate the opportunity to care for you. Gatha Mayer, MD, [facility]  Handouts given on diverticulosis and polyps  YOU HAD AN ENDOSCOPIC PROCEDURE TODAY: Refer to the procedure report and other information in the discharge instructions given to you for any specific questions about what was found during the examination. If this information does not answer your questions, please call [facility] office at [phone] to clarify.   YOU SHOULD EXPECT: Some feelings of bloating in the abdomen. Passage of more gas than usual. Walking can help get rid of the air that was put into your GI tract during the procedure and reduce the bloating. If you had a lower endoscopy (such as a colonoscopy or flexible sigmoidoscopy) you may notice spotting of blood in your stool or on the toilet paper. Some abdominal soreness may be present for a day or two, also.  DIET: Your first meal following the procedure should be a light meal and then it is ok to progress to your normal diet. A half-sandwich or bowl of soup is an example of a good first meal. Heavy or fried foods are harder to digest and may make you feel nauseous or bloated. Drink plenty of fluids but you should avoid alcoholic beverages for 24 hours. If you had a esophageal dilation, please see attached instructions for diet.    ACTIVITY: Your care partner should take you home directly after the procedure. You should plan to take it easy, moving slowly for the rest of the day. You can resume normal activity the day after the procedure however YOU SHOULD NOT DRIVE, use power tools, machinery or perform tasks that involve climbing or major physical exertion for 24 hours (because of the sedation medicines used  during the test).   SYMPTOMS TO REPORT IMMEDIATELY: A gastroenterologist can be reached at any hour. Please call [phone]  for any of the following symptoms:  Following lower endoscopy (colonoscopy, flexible sigmoidoscopy) Excessive amounts of blood in the stool  Significant tenderness, worsening of abdominal pains  Swelling of the abdomen that is new, acute  Fever of 100 or higher    FOLLOW UP:  If any biopsies were taken you will be contacted by phone or by letter within the next 1-3 weeks. Call [phone]  if you have not heard about the biopsies in 3 weeks.  Please also call with any specific questions about appointments or follow up tests.

## 2017-02-07 NOTE — Op Note (Signed)
[name] Patient Name: Barbara Miles Procedure Date: 02/07/2017 11:07 AM MRN: [id number] Endoscopist: Gatha Mayer , MD Age: 66 Referring MD:  Date of Birth: 1951/02/26 Gender: Female Account #: [id number] Procedure:                Colonoscopy Indications:              Screening for colorectal malignant neoplasm, This                            is the patient's first colonoscopy Medicines:                Propofol per Anesthesia, Monitored Anesthesia Care Procedure:                Pre-Anesthesia Assessment:                           - Prior to the procedure, a History and Physical                            was performed, and patient medications and                            allergies were reviewed. The patient's tolerance of                            previous anesthesia was also reviewed. The risks                            and benefits of the procedure and the sedation                            options and risks were discussed with the patient.                            All questions were answered, and informed consent                            was obtained. Prior Anticoagulants: The patient                            last took aspirin 1 day prior to the procedure. ASA                            Grade Assessment: II - A patient with mild systemic                            disease. After reviewing the risks and benefits,                            the patient was deemed in satisfactory condition to                            undergo the procedure.  After obtaining informed consent, the colonoscope                            was passed under direct vision. Throughout the                            procedure, the patient's blood pressure, pulse, and                            oxygen saturations were monitored continuously. The                            Colonoscope was introduced through the anus and                            advanced to the  the cecum, identified by                            appendiceal orifice and ileocecal valve. The                            colonoscopy was performed without difficulty. The                            patient tolerated the procedure well. The quality                            of the bowel preparation was adequate. The bowel                            preparation used was Miralax. The ileocecal valve,                            appendiceal orifice, and rectum were photographed. Scope In: 11:14:35 AM Scope Out: 11:31:18 AM Scope Withdrawal Time: 0 hours 13 minutes 13 seconds  Total Procedure Duration: 0 hours 16 minutes 43 seconds  Findings:                 The perianal and digital rectal examinations were                            normal.                           Two sessile polyps were found in the descending                            colon and transverse colon. The polyps were                            diminutive in size. These polyps were removed with                            a cold snare. Resection and retrieval were  complete. Verification of patient identification                            for the specimen was done. Estimated blood loss was                            minimal.                           Multiple small and large-mouthed diverticula were                            found in the sigmoid colon.                           The exam was otherwise without abnormality on                            direct and retroflexion views. Complications:            No immediate complications. Estimated Blood Loss:     Estimated blood loss was minimal. Impression:               - Two diminutive polyps in the descending colon and                            in the transverse colon, removed with a cold snare.                            Resected and retrieved.                           - Diverticulosis in the sigmoid colon.                           - The  examination was otherwise normal on direct                            and retroflexion views. Recommendation:           - Patient has a contact number available for                            emergencies. The signs and symptoms of potential                            delayed complications were discussed with the                            patient. Return to normal activities tomorrow.                            Written discharge instructions were provided to the                            patient.                           -  Resume previous diet.                           - Continue present medications.                           - Repeat colonoscopy is recommended. The                            colonoscopy date will be determined after pathology                            results from today's exam become available for                            review. Gatha Mayer, MD 02/07/2017 11:35:48 AM This report has been signed electronically.

## 2017-02-07 NOTE — Progress Notes (Signed)
Called to room to assist during endoscopic procedure.  Patient ID and intended procedure confirmed with present staff. Received instructions for my participation in the procedure from the performing physician.  

## 2017-02-07 NOTE — Progress Notes (Signed)
A/ox3 pleased with MAC, report to [facility]

## 2017-02-10 ENCOUNTER — Telehealth: Payer: Self-pay

## 2017-02-10 NOTE — Telephone Encounter (Signed)
  Follow up Call-  Call back number 02/07/2017  Post procedure Call Back phone  # [phone]  Permission to leave phone message Yes  Some recent data might be hidden     Patient questions:  Do you have a fever, pain , or abdominal swelling? No. Pain Score  0 *  Have you tolerated food without any problems? Yes.    Have you been able to return to your normal activities? Yes.    Do you have any questions about your discharge instructions: Diet   No. Medications  No. Follow up visit  No.  Do you have questions or concerns about your Care? No.  Actions: * If pain score is 4 or above: No action needed, pain <4.

## 2017-02-18 ENCOUNTER — Encounter: Payer: Self-pay | Admitting: Internal Medicine

## 2017-02-18 DIAGNOSIS — Z86010 Personal history of colonic polyps: Secondary | ICD-10-CM

## 2017-02-18 DIAGNOSIS — Z860101 Personal history of adenomatous and serrated colon polyps: Secondary | ICD-10-CM | POA: Insufficient documentation

## 2017-02-18 HISTORY — DX: Personal history of colonic polyps: Z86.010

## 2017-02-18 HISTORY — DX: Personal history of adenomatous and serrated colon polyps: Z86.0101

## 2017-02-18 NOTE — Progress Notes (Signed)
Diminutive adenoma  Recall colonoscopy 5 years 2023 approx My Chart letter

## 2017-04-16 ENCOUNTER — Ambulatory Visit (INDEPENDENT_AMBULATORY_CARE_PROVIDER_SITE_OTHER): Payer: BLUE CROSS/BLUE SHIELD | Admitting: Family Medicine

## 2017-04-16 ENCOUNTER — Encounter: Payer: Self-pay | Admitting: Family Medicine

## 2017-04-16 DIAGNOSIS — I10 Essential (primary) hypertension: Secondary | ICD-10-CM | POA: Diagnosis not present

## 2017-04-16 DIAGNOSIS — E7800 Pure hypercholesterolemia, unspecified: Secondary | ICD-10-CM | POA: Diagnosis not present

## 2017-04-16 DIAGNOSIS — I739 Peripheral vascular disease, unspecified: Secondary | ICD-10-CM

## 2017-04-16 DIAGNOSIS — M899 Disorder of bone, unspecified: Secondary | ICD-10-CM | POA: Insufficient documentation

## 2017-04-16 DIAGNOSIS — E669 Obesity, unspecified: Secondary | ICD-10-CM

## 2017-04-16 DIAGNOSIS — G8929 Other chronic pain: Secondary | ICD-10-CM

## 2017-04-16 DIAGNOSIS — Z6830 Body mass index (BMI) 30.0-30.9, adult: Secondary | ICD-10-CM | POA: Diagnosis not present

## 2017-04-16 DIAGNOSIS — Z0000 Encounter for general adult medical examination without abnormal findings: Secondary | ICD-10-CM

## 2017-04-16 DIAGNOSIS — M949 Disorder of cartilage, unspecified: Secondary | ICD-10-CM

## 2017-04-16 MED ORDER — HYDROCODONE-ACETAMINOPHEN 7.5-325 MG PO TABS: 1.0000 | ORAL_TABLET | Freq: Three times a day (TID) | ORAL | 0 refills | Status: DC | PRN

## 2017-04-16 MED ORDER — HYDROCODONE-ACETAMINOPHEN 7.5-325 MG PO TABS: 1.0000 | ORAL_TABLET | Freq: Four times a day (QID) | ORAL | 0 refills | Status: DC | PRN

## 2017-04-16 MED ORDER — HYDROCODONE-ACETAMINOPHEN 7.5-325 MG PO TABS: ORAL_TABLET | ORAL | 0 refills | Status: DC

## 2017-04-16 NOTE — Assessment & Plan Note (Signed)
Healthy female with good habits and up to date on preventive med interventions.

## 2017-04-16 NOTE — Patient Instructions (Addendum)
When you turn 66, you will get two different pneumonia vaccines, one year apart. There is a new shingles vaccine.  We will talk more when the nationwide shortage is over. The only thing to improve on is your weight. I will call with the cholesterol report.  Your cholesterol has been up in the past and I will like recommend medications. See me in three months.

## 2017-04-17 LAB — LIPID PANEL
CHOL/HDL RATIO: 4.6 ratio — AB (ref 0.0–4.4)
Cholesterol, Total: 228 mg/dL — ABNORMAL HIGH (ref 100–199)
HDL: 50 mg/dL (ref >39)
LDL CALC: 136 mg/dL — AB (ref 0–99)
Triglycerides: 212 mg/dL — ABNORMAL HIGH (ref 0–149)
VLDL CHOLESTEROL CAL: 42 mg/dL — AB (ref 5–40)

## 2017-04-17 LAB — VITAMIN D 25 HYDROXY (VIT D DEFICIENCY, FRACTURES): VIT D 25 HYDROXY: 39.4 ng/mL (ref 30.0–100.0)

## 2017-04-17 MED ORDER — ATORVASTATIN CALCIUM 40 MG PO TABS: 40.0000 mg | ORAL_TABLET | Freq: Every day | ORAL | 3 refills | Status: DC

## 2017-04-17 NOTE — Assessment & Plan Note (Signed)
ASCVD risk is 8.7.  Also has known PVD.  Recommend atorvastatin.

## 2017-04-17 NOTE — Assessment & Plan Note (Signed)
Will need to be secondary prevention for cholesterol treatment.
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
## 2017-04-17 NOTE — Assessment & Plan Note (Signed)
Recommend wt loss.  This is her biggest reversible risk factor.

## 2017-04-17 NOTE — Assessment & Plan Note (Signed)
Check Vit D 

## 2017-04-17 NOTE — Assessment & Plan Note (Signed)
Renew narcotics.  Total daily dose is 22.5 oral morphine equivalents.

## 2017-04-17 NOTE — Assessment & Plan Note (Signed)
Well controled. 

## 2017-05-05 ENCOUNTER — Telehealth: Payer: Self-pay | Admitting: Family Medicine

## 2017-05-20 ENCOUNTER — Other Ambulatory Visit: Payer: Self-pay | Admitting: Family Medicine

## 2017-05-26 ENCOUNTER — Telehealth: Payer: Self-pay | Admitting: *Deleted

## 2017-05-26 NOTE — Telephone Encounter (Signed)
Prior Authorization received from [employer] for cyclobenzaprine.  PA form placed in provider box for completion. Derl Barrow, RN

## 2017-05-27 NOTE — Telephone Encounter (Signed)
Form completed.

## 2017-05-27 NOTE — Telephone Encounter (Signed)
PA form for cyclobenzaprine faxed to [employer] for review.  Derl Barrow, RN

## 2017-05-29 MED ORDER — BACLOFEN 10 MG PO TABS: 10.0000 mg | ORAL_TABLET | Freq: Every day | ORAL | 3 refills | Status: DC

## 2017-05-29 NOTE — Telephone Encounter (Signed)
Refill refused.  Discussed with patient.  Likely because cyclobenzaprine is a [place] drug.  Will switch to baclofen.

## 2017-05-29 NOTE — Addendum Note (Signed)
Addended by: Zenia Resides on: 05/29/2017 03:27 PM   Modules accepted: Orders

## 2017-05-29 NOTE — Telephone Encounter (Signed)
PA for cyclobenzaprine 10 mg tablet for denied via [facility] for daily use.  Denial placed in provider box to review.  Derl Barrow, RN

## 2017-07-17 ENCOUNTER — Ambulatory Visit (INDEPENDENT_AMBULATORY_CARE_PROVIDER_SITE_OTHER): Payer: PPO | Admitting: Family Medicine

## 2017-07-17 ENCOUNTER — Encounter: Payer: Self-pay | Admitting: Family Medicine

## 2017-07-17 DIAGNOSIS — G8929 Other chronic pain: Secondary | ICD-10-CM | POA: Diagnosis not present

## 2017-07-17 DIAGNOSIS — M797 Fibromyalgia: Secondary | ICD-10-CM

## 2017-07-17 MED ORDER — GABAPENTIN 100 MG PO CAPS: 100.0000 mg | ORAL_CAPSULE | Freq: Three times a day (TID) | ORAL | 3 refills | Status: DC

## 2017-07-17 MED ORDER — HYDROCODONE-ACETAMINOPHEN 7.5-325 MG PO TABS: 1.0000 | ORAL_TABLET | Freq: Four times a day (QID) | ORAL | 0 refills | Status: DC | PRN

## 2017-07-17 MED ORDER — HYDROCODONE-ACETAMINOPHEN 7.5-325 MG PO TABS: ORAL_TABLET | ORAL | 0 refills | Status: DC

## 2017-07-17 MED ORDER — HYDROCODONE-ACETAMINOPHEN 7.5-325 MG PO TABS
1.0000 | ORAL_TABLET | Freq: Three times a day (TID) | ORAL | 0 refills | Status: DC | PRN
Start: 2017-07-17 — End: 2017-10-22

## 2017-07-17 NOTE — Patient Instructions (Signed)
Please schedule an Annual Wellness visit with my nurse for both you and Quita Skye. I sent in a new prescription for gabapentin. See me in three months.

## 2017-07-18 NOTE — Assessment & Plan Note (Signed)
Refilled chronic narcotics.  No plan to escalate or wean at this time.

## 2017-07-18 NOTE — Assessment & Plan Note (Signed)
Expressed my belief that gabapentin is a great idea - I should have tried sooner.  She will start at low dose.  FU in three months to see if helping.

## 2017-07-18 NOTE — Progress Notes (Signed)
   Subjective:    Patient ID: Barbara Miles, female    DOB: 07-21-1951, 66 y.o.   MRN: [id number]  HPI  Here for refill of chronic narcotics.  States she is more troubled by her leg pain and fibromyalgia, which was previously well contolred.   No specific focal pain.  Just pain all over. She asks about gabapentin as a possibility.  I reviewed chart and was surprised that I had not previously tried.   Review of Systems     Objective:   Physical ExamLungs clear Cardiac rRR without m or g.        Assessment & Plan:

## 2017-10-22 ENCOUNTER — Ambulatory Visit: Payer: PPO | Admitting: Family Medicine

## 2017-10-22 ENCOUNTER — Other Ambulatory Visit: Payer: Self-pay

## 2017-10-22 ENCOUNTER — Encounter: Payer: Self-pay | Admitting: Family Medicine

## 2017-10-22 VITALS — BP 130/58 | HR 54 | Temp 98.6°F | Ht 65.0 in | Wt 194.6 lb

## 2017-10-22 DIAGNOSIS — G8929 Other chronic pain: Secondary | ICD-10-CM

## 2017-10-22 DIAGNOSIS — Z23 Encounter for immunization: Secondary | ICD-10-CM | POA: Diagnosis not present

## 2017-10-22 MED ORDER — HYDROCODONE-ACETAMINOPHEN 7.5-325 MG PO TABS: 1.0000 | ORAL_TABLET | Freq: Four times a day (QID) | ORAL | 0 refills | Status: DC | PRN

## 2017-10-22 MED ORDER — HYDROCODONE-ACETAMINOPHEN 7.5-325 MG PO TABS: ORAL_TABLET | ORAL | 0 refills | Status: DC

## 2017-10-22 MED ORDER — HYDROCODONE-ACETAMINOPHEN 7.5-325 MG PO TABS: 1.0000 | ORAL_TABLET | Freq: Three times a day (TID) | ORAL | 0 refills | Status: DC | PRN

## 2017-10-22 MED ORDER — CYCLOBENZAPRINE HCL 10 MG PO TABS: 10.0000 mg | ORAL_TABLET | Freq: Every day | ORAL | 6 refills | Status: DC

## 2017-10-22 NOTE — Patient Instructions (Signed)
I will give you the pain medicine prescriptions. I sent in a refillable prescription for flexeril. Stop the baclofen and gabapentin Call me in January or February if you want to try cymbalta for the fibromyalgia or leg pain.

## 2017-10-24 ENCOUNTER — Encounter: Payer: Self-pay | Admitting: Family Medicine

## 2017-10-24 NOTE — Assessment & Plan Note (Signed)
No change in opioids.  Back to flexeril despite being a beers drug.
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
## 2017-12-22 ENCOUNTER — Other Ambulatory Visit: Payer: Self-pay | Admitting: Family Medicine

## 2018-01-15 ENCOUNTER — Ambulatory Visit (INDEPENDENT_AMBULATORY_CARE_PROVIDER_SITE_OTHER): Payer: PPO | Admitting: Family Medicine

## 2018-01-15 ENCOUNTER — Encounter: Payer: Self-pay | Admitting: Family Medicine

## 2018-01-15 ENCOUNTER — Other Ambulatory Visit: Payer: Self-pay

## 2018-01-15 DIAGNOSIS — G8929 Other chronic pain: Secondary | ICD-10-CM

## 2018-01-15 DIAGNOSIS — I739 Peripheral vascular disease, unspecified: Secondary | ICD-10-CM

## 2018-01-15 DIAGNOSIS — E2839 Other primary ovarian failure: Secondary | ICD-10-CM | POA: Diagnosis not present

## 2018-01-15 DIAGNOSIS — R4589 Other symptoms and signs involving emotional state: Secondary | ICD-10-CM | POA: Diagnosis not present

## 2018-01-15 DIAGNOSIS — F411 Generalized anxiety disorder: Secondary | ICD-10-CM

## 2018-01-15 IMAGING — CT CT ABD-PELV W/ CM
2 of 5 series · 16 of 46 positions shown, 18 images · IV contrast (Isovue)
Comparison: 11/26/2012

CLINICAL DATA: Lower abdominal pain and cramping

EXAM:
CT ABDOMEN AND PELVIS WITH CONTRAST
TECHNIQUE: Multidetector CT imaging of the abdomen and pelvis was performed
using the standard protocol following bolus administration of
intravenous contrast.
CONTRAST:  100mL 2MS1TL-488 IOPAMIDOL (2MS1TL-488) INJECTION 61%

[Series 2: axial st · axial · 0.92mm/px · z∈[-590,-195]mm · 13 of 91 slices shown, 15 images]
[im 6/91  soft-tissue]
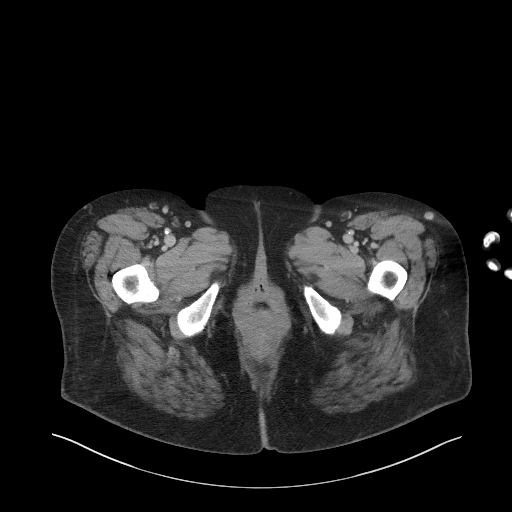
[im 6/91  bone]
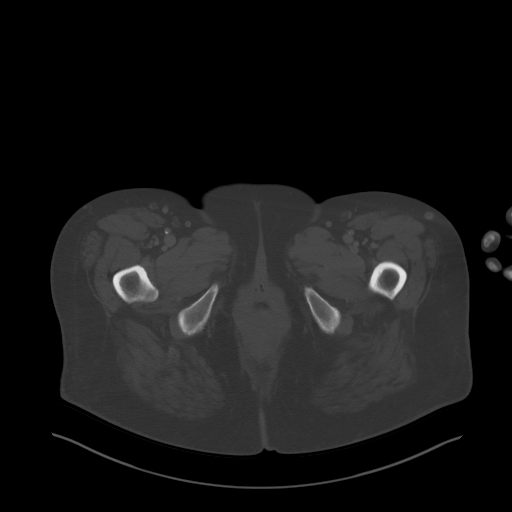
[im 11/91  soft-tissue]
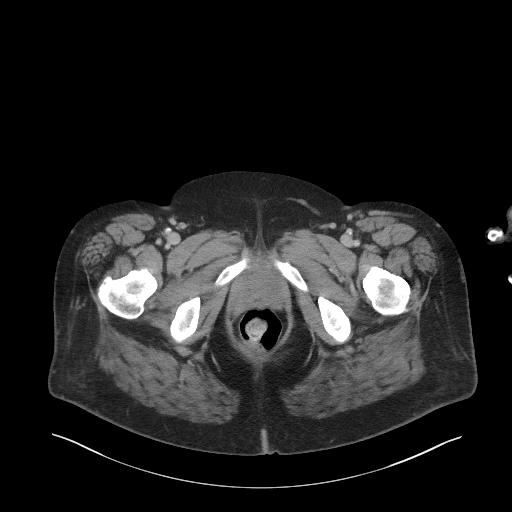
[im 22/91  soft-tissue]
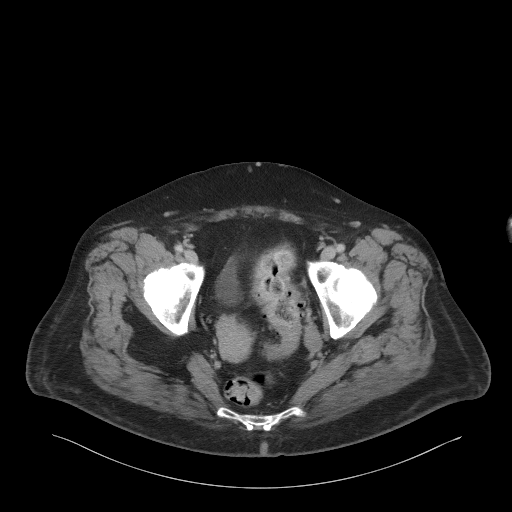
[im 27/91  soft-tissue]
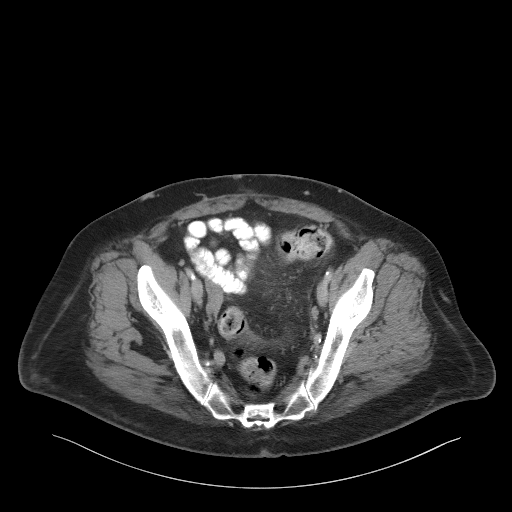
[im 32/91  soft-tissue]
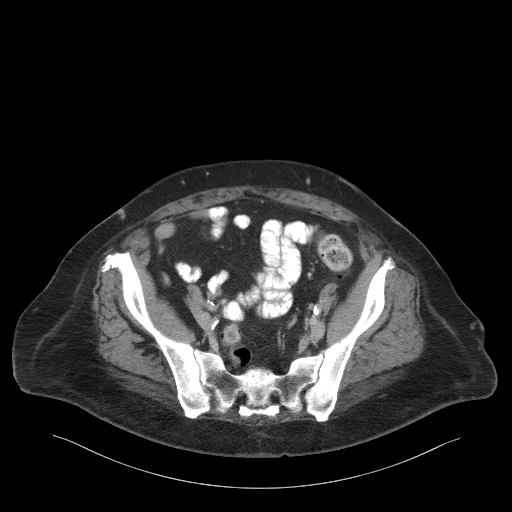
[im 38/91  soft-tissue]
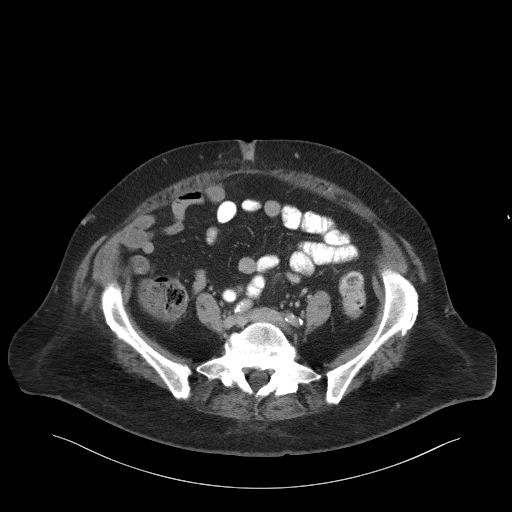
[im 48/91  soft-tissue]
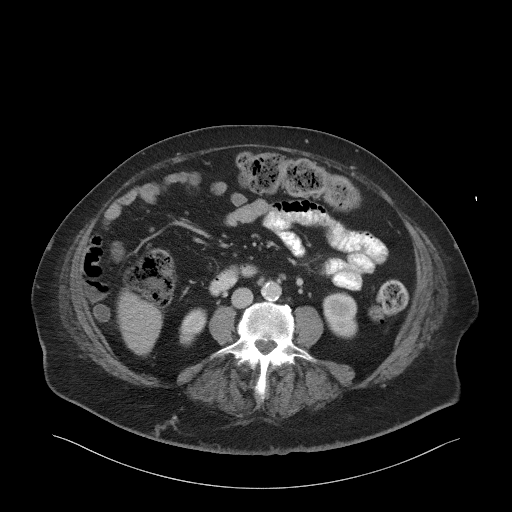
[im 53/91  soft-tissue]
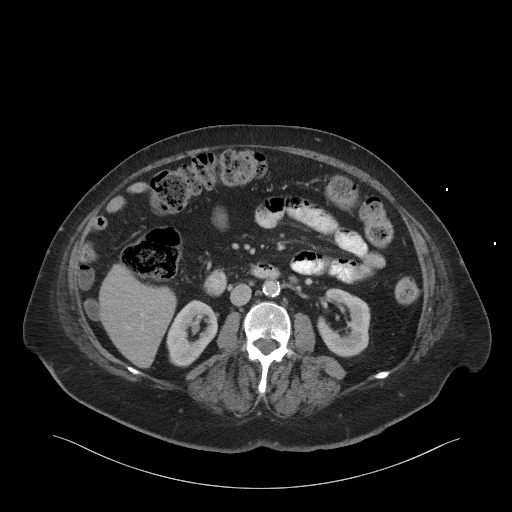
[im 59/91  soft-tissue]
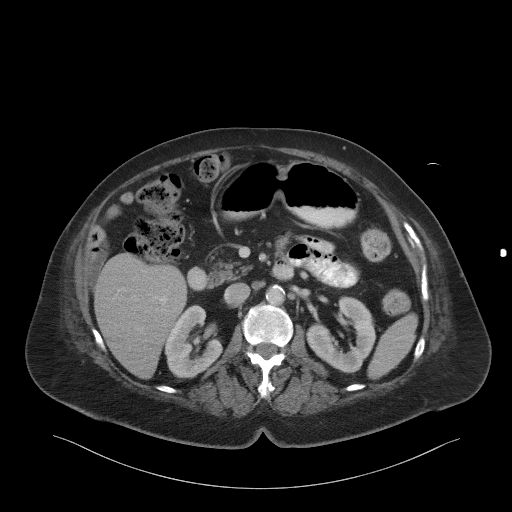
[im 59/91  bone]
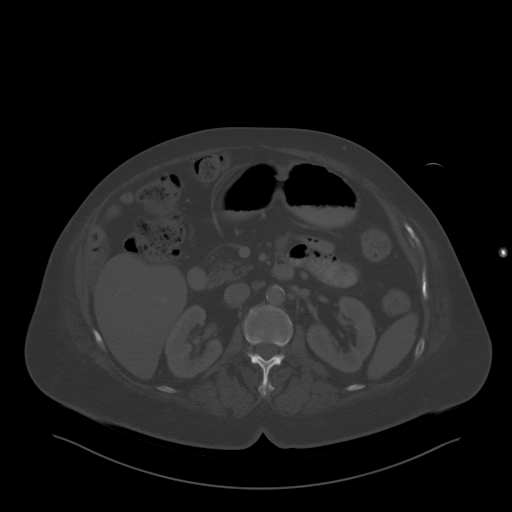
[im 64/91  soft-tissue]
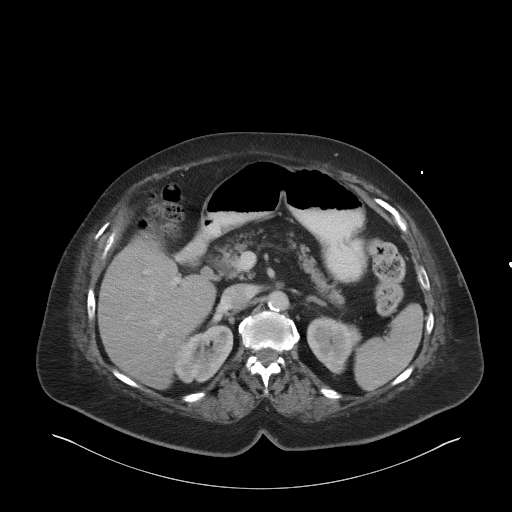
[im 69/91  soft-tissue]
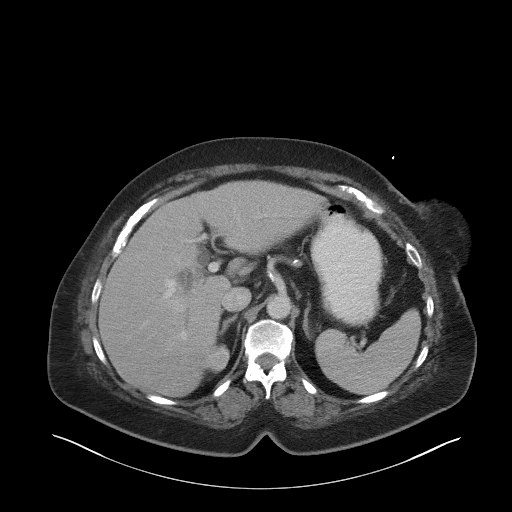
[im 80/91  soft-tissue]
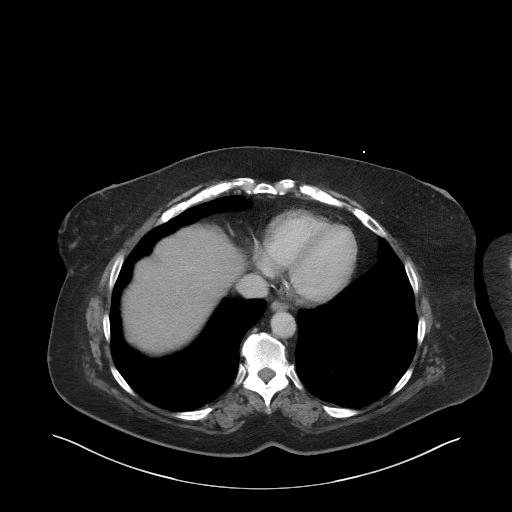
[im 85/91  soft-tissue]
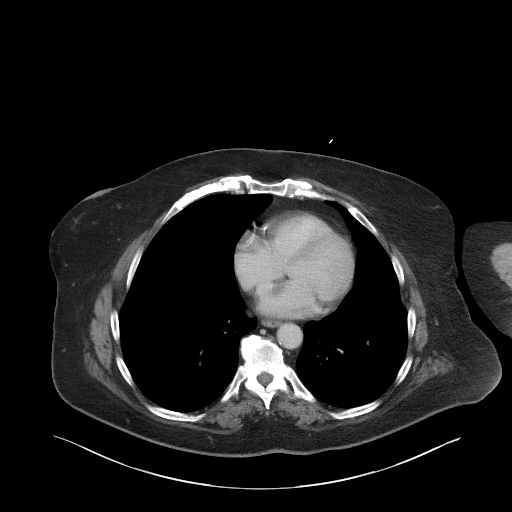

[Series 6: coronal st · coronal · 0.78mm/px · 3 of 101 slices shown]
[im 34/101  soft-tissue]
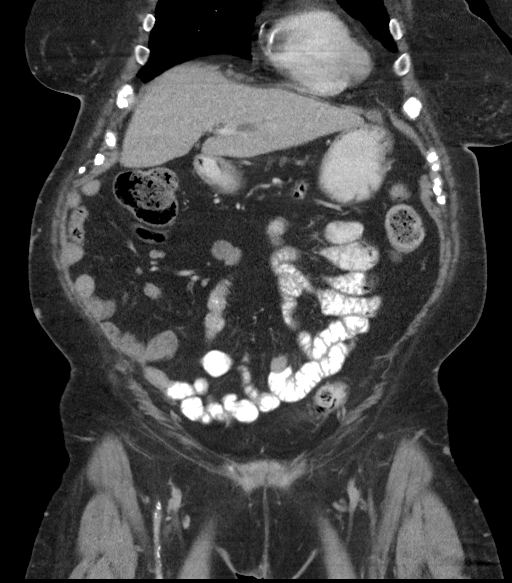
[im 45/101  soft-tissue]
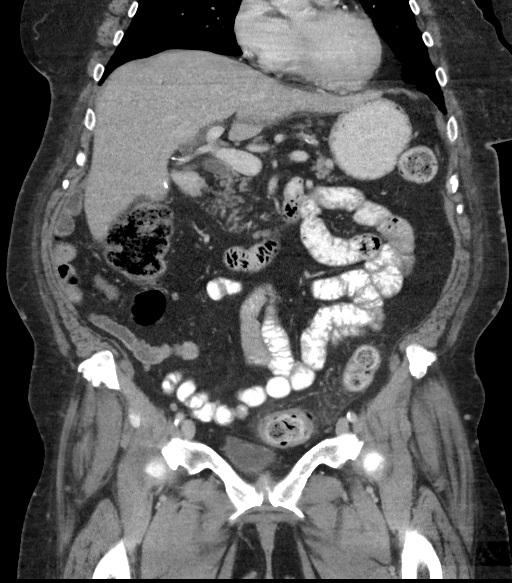
[im 56/101  soft-tissue]
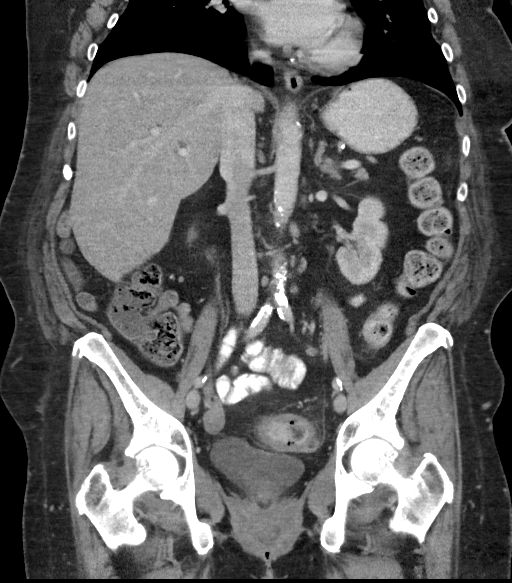

[16 of 46 positions shown; findings below may reference images not displayed]

FINDINGS: Lower chest: Lung bases are free of acute infiltrate or sizable
effusion. Stable tiny nodules are again identified. Given their
stability they are felt to be benign in etiology. Coronary
calcifications are noted.

Hepatobiliary: No focal liver abnormality is seen. Status post
cholecystectomy. No biliary dilatation.

Pancreas: Unremarkable. No pancreatic ductal dilatation or
surrounding inflammatory changes.

Spleen: Normal in size without focal abnormality.

Adrenals/Urinary Tract: Tiny nonobstructing renal calculi are noted
bilaterally. No ureteral stones are seen. The bladder is partially
distended. The adrenal glands are within normal limits. A few
scattered cysts are noted as well.

Stomach/Bowel: Appendix is within normal limits. No inflammatory
changes are seen. Mild diverticular change of the colon is seen with
very mild pericolonic inflammatory changes consistent with early
diverticulitis. No perforation or abscess is seen.

Vascular/Lymphatic: Aortic atherosclerosis. No enlarged abdominal or
pelvic lymph nodes.

Reproductive: Uterus and bilateral adnexa are unremarkable.

Other: Small fat containing anterior abdominal wall hernia is noted
just above the umbilicus. The previously seen periumbilical abscess
has resolved in the interval.

Musculoskeletal: No acute or significant osseous findings.
IMPRESSION: Changes consistent with early diverticulitis.

Tiny nonobstructing renal calculi.

Small fat containing anterior abdominal wall hernia.

## 2018-01-15 MED ORDER — HYDROCODONE-ACETAMINOPHEN 7.5-325 MG PO TABS: 1.0000 | ORAL_TABLET | Freq: Four times a day (QID) | ORAL | 0 refills | Status: DC | PRN

## 2018-01-15 MED ORDER — HYDROCODONE-ACETAMINOPHEN 7.5-325 MG PO TABS: 1.0000 | ORAL_TABLET | Freq: Three times a day (TID) | ORAL | 0 refills | Status: DC | PRN

## 2018-01-15 MED ORDER — SERTRALINE HCL 100 MG PO TABS
100.0000 mg | ORAL_TABLET | Freq: Every day | ORAL | 3 refills | Status: DC
Start: 2018-01-15 — End: 2018-05-19

## 2018-01-15 MED ORDER — HYDROCODONE-ACETAMINOPHEN 7.5-325 MG PO TABS: ORAL_TABLET | ORAL | 0 refills | Status: DC

## 2018-01-15 NOTE — Patient Instructions (Signed)
I will give you your pain med prescription. Nothing to do for the tinnitis I sent in a prescription for generic zoloft. See me in three months if things are going.  Sooner if problems.

## 2018-01-16 ENCOUNTER — Encounter: Payer: Self-pay | Admitting: Family Medicine

## 2018-01-16 NOTE — Assessment & Plan Note (Signed)
Continue risk factor modification 
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
## 2018-01-16 NOTE — Assessment & Plan Note (Signed)
Continue current regimen
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
## 2018-01-16 NOTE — Assessment & Plan Note (Signed)
Add SSRI.  Also discussed adjunct therapy of exercise and socializing more.  Husband will see neuro for possible alteration in Parkinsonism meds.

## 2018-04-16 ENCOUNTER — Encounter: Payer: Self-pay | Admitting: Family Medicine

## 2018-04-16 ENCOUNTER — Other Ambulatory Visit: Payer: Self-pay

## 2018-04-16 ENCOUNTER — Ambulatory Visit (INDEPENDENT_AMBULATORY_CARE_PROVIDER_SITE_OTHER): Payer: PPO | Admitting: Family Medicine

## 2018-04-16 DIAGNOSIS — Z0000 Encounter for general adult medical examination without abnormal findings: Secondary | ICD-10-CM

## 2018-04-16 DIAGNOSIS — G8929 Other chronic pain: Secondary | ICD-10-CM

## 2018-04-16 DIAGNOSIS — I739 Peripheral vascular disease, unspecified: Secondary | ICD-10-CM | POA: Diagnosis not present

## 2018-04-16 DIAGNOSIS — I10 Essential (primary) hypertension: Secondary | ICD-10-CM

## 2018-04-16 DIAGNOSIS — E7800 Pure hypercholesterolemia, unspecified: Secondary | ICD-10-CM

## 2018-04-16 MED ORDER — HYDROCODONE-ACETAMINOPHEN 7.5-325 MG PO TABS: 1.0000 | ORAL_TABLET | Freq: Three times a day (TID) | ORAL | 0 refills | Status: DC

## 2018-04-16 MED ORDER — HYDROCODONE-ACETAMINOPHEN 7.5-325 MG PO TABS
1.0000 | ORAL_TABLET | Freq: Three times a day (TID) | ORAL | 0 refills | Status: DC
Start: 2018-04-16 — End: 2018-07-21

## 2018-04-16 MED ORDER — HYDROCODONE-ACETAMINOPHEN 7.5-325 MG PO TABS
ORAL_TABLET | ORAL | 0 refills | Status: DC
Start: 2018-04-16 — End: 2018-07-21

## 2018-04-16 NOTE — Patient Instructions (Signed)
I will call next week with your lab test results.

## 2018-04-16 NOTE — Assessment & Plan Note (Signed)
Healthy female with no at risk behaviors and good health habits. 

## 2018-04-16 NOTE — Assessment & Plan Note (Signed)
Well controled. 
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
## 2018-04-16 NOTE — Assessment & Plan Note (Addendum)
Well controled.  Recheck labs.  Has been off atorvastatin and LDL=200.  Called start crestor

## 2018-04-16 NOTE — Assessment & Plan Note (Signed)
Refilled chronic pain meds 

## 2018-04-16 NOTE — Assessment & Plan Note (Signed)
Secondary prevention.  Cont ASA and atorvastatin.

## 2018-04-17 LAB — CMP14+EGFR
ALK PHOS: 67 IU/L (ref 39–117)
ALT: 16 IU/L (ref 0–32)
AST: 31 IU/L (ref 0–40)
Albumin/Globulin Ratio: 1.4 (ref 1.2–2.2)
Albumin: 4.4 g/dL (ref 3.6–4.8)
BILIRUBIN TOTAL: 0.5 mg/dL (ref 0.0–1.2)
BUN/Creatinine Ratio: 16 (ref 12–28)
BUN: 17 mg/dL (ref 8–27)
CHLORIDE: 97 mmol/L (ref 96–106)
CO2: 27 mmol/L (ref 20–29)
CREATININE: 1.08 mg/dL — AB (ref 0.57–1.00)
Calcium: 10.2 mg/dL (ref 8.7–10.3)
GFR calc Af Amer: 62 mL/min/{1.73_m2} (ref >59)
GFR calc non Af Amer: 54 mL/min/{1.73_m2} — ABNORMAL LOW (ref >59)
GLOBULIN, TOTAL: 3.1 g/dL (ref 1.5–4.5)
Glucose: 114 mg/dL — ABNORMAL HIGH (ref 65–99)
Potassium: 4.4 mmol/L (ref 3.5–5.2)
SODIUM: 140 mmol/L (ref 134–144)
Total Protein: 7.5 g/dL (ref 6.0–8.5)

## 2018-04-17 LAB — CBC
HEMATOCRIT: 38 % (ref 34.0–46.6)
HEMOGLOBIN: 13 g/dL (ref 11.1–15.9)
MCH: 27.7 pg (ref 26.6–33.0)
MCHC: 34.2 g/dL (ref 31.5–35.7)
MCV: 81 fL (ref 79–97)
Platelets: 204 10*3/uL (ref 150–450)
RBC: 4.7 x10E6/uL (ref 3.77–5.28)
RDW: 14.9 % (ref 12.3–15.4)
WBC: 7.5 10*3/uL (ref 3.4–10.8)

## 2018-04-17 LAB — LIPID PANEL
CHOLESTEROL TOTAL: 276 mg/dL — AB (ref 100–199)
Chol/HDL Ratio: 5.9 ratio — ABNORMAL HIGH (ref 0.0–4.4)
HDL: 47 mg/dL (ref >39)
LDL Calculated: 200 mg/dL — ABNORMAL HIGH (ref 0–99)
TRIGLYCERIDES: 144 mg/dL (ref 0–149)
VLDL Cholesterol Cal: 29 mg/dL (ref 5–40)

## 2018-04-21 MED ORDER — ROSUVASTATIN CALCIUM 10 MG PO TABS: 10.0000 mg | ORAL_TABLET | Freq: Every day | ORAL | 11 refills | Status: DC

## 2018-04-21 NOTE — Addendum Note (Signed)
Addended by: Zenia Resides on: 04/21/2018 09:40 AM   Modules accepted: Orders

## 2018-05-19 ENCOUNTER — Other Ambulatory Visit: Payer: Self-pay | Admitting: Family Medicine

## 2018-05-19 DIAGNOSIS — M797 Fibromyalgia: Secondary | ICD-10-CM

## 2018-05-19 DIAGNOSIS — R4589 Other symptoms and signs involving emotional state: Secondary | ICD-10-CM

## 2018-07-21 ENCOUNTER — Encounter: Payer: Self-pay | Admitting: Family Medicine

## 2018-07-21 ENCOUNTER — Ambulatory Visit (INDEPENDENT_AMBULATORY_CARE_PROVIDER_SITE_OTHER): Payer: PPO | Admitting: Family Medicine

## 2018-07-21 ENCOUNTER — Other Ambulatory Visit: Payer: Self-pay

## 2018-07-21 DIAGNOSIS — Z23 Encounter for immunization: Secondary | ICD-10-CM | POA: Diagnosis not present

## 2018-07-21 DIAGNOSIS — G8929 Other chronic pain: Secondary | ICD-10-CM | POA: Diagnosis not present

## 2018-07-21 DIAGNOSIS — K439 Ventral hernia without obstruction or gangrene: Secondary | ICD-10-CM | POA: Diagnosis not present

## 2018-07-21 MED ORDER — HYDROCODONE-ACETAMINOPHEN 7.5-325 MG PO TABS: 1.0000 | ORAL_TABLET | Freq: Three times a day (TID) | ORAL | 0 refills | Status: DC

## 2018-07-21 MED ORDER — HYDROCODONE-ACETAMINOPHEN 7.5-325 MG PO TABS
ORAL_TABLET | ORAL | 0 refills | Status: DC
Start: 2018-07-21 — End: 2018-10-07

## 2018-07-21 NOTE — Patient Instructions (Signed)
Please get the hernia repaired at your earliest convenience.  Someone should call to set up the appointment.

## 2018-07-21 NOTE — Progress Notes (Signed)
   Subjective:    Patient ID: Barbara Miles, female    DOB: 04/19/1951, 67 y.o.   MRN: [id number]  HPI Enlarging ant abd swelling. Has had periumbilical problems since gall bladder surgery in 2010.  Initially an abscess.  Now with enlarging swelling that is more prominent when she stands or strains.  Present for years No change in bowel, bladder, weight or appetite.  No fever. Review of Abd CT 12/2016 shows hernia   Review of Systems     Objective:   Physical Exam Large but easily reducable ventral hernia.  Otherwise benign abd exam.        Assessment & Plan:

## 2018-07-21 NOTE — Assessment & Plan Note (Signed)
Stable, refilled chronic pain meds.

## 2018-07-21 NOTE — Assessment & Plan Note (Addendum)
Surgery referral for elective repair  Educated.

## 2018-08-20 ENCOUNTER — Telehealth: Payer: Self-pay | Admitting: Family Medicine

## 2018-08-20 NOTE — Telephone Encounter (Signed)
Pt needs a copy of her most recent physical. She needs this for her insurance company. Pt would like for someone to call her when this is ready to be picked up. Her husband will be in [place] this afternoon around 3 and would like to be able to pick it up then if possible. I told Pt we can not guarantee that it will be ready today but someone still call them when it is ready to be picked up.

## 2018-08-21 NOTE — Telephone Encounter (Signed)
Informed pt that a Patient Request for Access would be up front for her to fill out and then the front desk would be able to print her last physical information. Katharina Caper, April D, [place]

## 2018-10-07 ENCOUNTER — Ambulatory Visit (INDEPENDENT_AMBULATORY_CARE_PROVIDER_SITE_OTHER): Payer: PPO | Admitting: Family Medicine

## 2018-10-07 ENCOUNTER — Encounter: Payer: Self-pay | Admitting: Family Medicine

## 2018-10-07 ENCOUNTER — Other Ambulatory Visit: Payer: Self-pay

## 2018-10-07 DIAGNOSIS — K432 Incisional hernia without obstruction or gangrene: Secondary | ICD-10-CM

## 2018-10-07 DIAGNOSIS — G8929 Other chronic pain: Secondary | ICD-10-CM

## 2018-10-07 DIAGNOSIS — K439 Ventral hernia without obstruction or gangrene: Secondary | ICD-10-CM

## 2018-10-07 DIAGNOSIS — I10 Essential (primary) hypertension: Secondary | ICD-10-CM

## 2018-10-07 DIAGNOSIS — M6208 Separation of muscle (nontraumatic), other site: Secondary | ICD-10-CM

## 2018-10-07 HISTORY — DX: Separation of muscle (nontraumatic), other site: M62.08

## 2018-10-07 MED ORDER — HYDROCODONE-ACETAMINOPHEN 7.5-325 MG PO TABS: ORAL_TABLET | ORAL | 0 refills | Status: DC

## 2018-10-07 MED ORDER — HYDROCODONE-ACETAMINOPHEN 7.5-325 MG PO TABS: 1.0000 | ORAL_TABLET | Freq: Three times a day (TID) | ORAL | 0 refills | Status: DC

## 2018-10-07 MED ORDER — LISINOPRIL 20 MG PO TABS: 20.0000 mg | ORAL_TABLET | Freq: Every day | ORAL | 12 refills | Status: DC

## 2018-10-07 NOTE — Progress Notes (Signed)
   Subjective:    Patient ID: Barbara Miles, female    DOB: [birth year], 67 y.o.   MRN: [id number]  HPI Multiple issues 1. Concern about about swelling/mass.  Has known incisional hernia periumbilical seen on old CT. 2. Refill chronic pain meds.  Wonders if she can go to 4 per day 3. Hypertension, perhaps overtreated.  Denies lightheadedness.  Does have considerable fatigue.     Review of Systems     Objective:   Physical ExamVS including low BP noted Abd diastasis recti.  Also orange sized reducable periumbilical hernea. May have small 2nd hernia at end of right subcostal incision.        Assessment & Plan:

## 2018-10-07 NOTE — Assessment & Plan Note (Signed)
Perhaps over treated.  DC HCTZ 

## 2018-10-07 NOTE — Patient Instructions (Addendum)
I am cutting out one of the two medications from your current high blood pressure pill.  Please keep an eye on your blood pressure to make sure it doesn't go too high.  I want readings to be below 140/90.   I refilled your pain medication You have a hernia.  It needs surgical repair.  No rush.  Do it at your convenience. You also have something called diastasis recti, which gives you the football shaped swelling in your belly.  It is not a problem.

## 2018-10-07 NOTE — Assessment & Plan Note (Signed)
Reassured that this is a non problem.

## 2018-10-07 NOTE — Assessment & Plan Note (Signed)
Surg referral.

## 2018-10-07 NOTE — Assessment & Plan Note (Signed)
Continue current med dose without escalation.

## 2019-01-06 ENCOUNTER — Other Ambulatory Visit: Payer: Self-pay

## 2019-01-06 ENCOUNTER — Ambulatory Visit (INDEPENDENT_AMBULATORY_CARE_PROVIDER_SITE_OTHER): Payer: PPO | Admitting: Family Medicine

## 2019-01-06 ENCOUNTER — Encounter: Payer: Self-pay | Admitting: Family Medicine

## 2019-01-06 VITALS — BP 102/60 | HR 72 | Temp 98.2°F | Ht 65.0 in | Wt 187.8 lb

## 2019-01-06 DIAGNOSIS — G8929 Other chronic pain: Secondary | ICD-10-CM

## 2019-01-06 DIAGNOSIS — E7800 Pure hypercholesterolemia, unspecified: Secondary | ICD-10-CM | POA: Diagnosis not present

## 2019-01-06 DIAGNOSIS — Z23 Encounter for immunization: Secondary | ICD-10-CM | POA: Diagnosis not present

## 2019-01-06 DIAGNOSIS — Z1239 Encounter for other screening for malignant neoplasm of breast: Secondary | ICD-10-CM | POA: Insufficient documentation

## 2019-01-06 MED ORDER — ROSUVASTATIN CALCIUM 10 MG PO TABS: 10.0000 mg | ORAL_TABLET | Freq: Every day | ORAL | 11 refills | Status: DC

## 2019-01-06 MED ORDER — HYDROCODONE-ACETAMINOPHEN 7.5-325 MG PO TABS: ORAL_TABLET | ORAL | 0 refills | Status: DC

## 2019-01-06 NOTE — Patient Instructions (Signed)
I now need to send your pain med refills electronically.  I will renew monthly.  Call me 4-5 days in advance. I still need to see you once every three months. Someone should call to set up a mammogram You should get your second and last pneumonia shot today. Say hi to [facility] for me.

## 2019-01-07 ENCOUNTER — Encounter: Payer: Self-pay | Admitting: Family Medicine

## 2019-01-07 NOTE — Assessment & Plan Note (Signed)
Now due for her q2y mammo

## 2019-01-07 NOTE — Assessment & Plan Note (Signed)
After discussion, she is willing to start crestor.
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
## 2019-01-07 NOTE — Assessment & Plan Note (Signed)
Refill meds.  No change.

## 2019-01-14 ENCOUNTER — Other Ambulatory Visit: Payer: Self-pay | Admitting: *Deleted

## 2019-01-14 DIAGNOSIS — G8929 Other chronic pain: Secondary | ICD-10-CM

## 2019-01-14 MED ORDER — HYDROCODONE-ACETAMINOPHEN 7.5-325 MG PO TABS: ORAL_TABLET | ORAL | 0 refills | Status: DC

## 2019-02-15 ENCOUNTER — Other Ambulatory Visit: Payer: Self-pay

## 2019-02-15 DIAGNOSIS — G8929 Other chronic pain: Secondary | ICD-10-CM

## 2019-02-16 MED ORDER — HYDROCODONE-ACETAMINOPHEN 7.5-325 MG PO TABS: ORAL_TABLET | ORAL | 0 refills | Status: DC

## 2019-03-17 ENCOUNTER — Other Ambulatory Visit: Payer: Self-pay | Admitting: *Deleted

## 2019-03-17 DIAGNOSIS — G8929 Other chronic pain: Secondary | ICD-10-CM

## 2019-03-17 MED ORDER — HYDROCODONE-ACETAMINOPHEN 7.5-325 MG PO TABS: ORAL_TABLET | ORAL | 0 refills | Status: DC

## 2019-05-19 ENCOUNTER — Other Ambulatory Visit: Payer: Self-pay | Admitting: Family Medicine

## 2019-05-19 DIAGNOSIS — R4589 Other symptoms and signs involving emotional state: Secondary | ICD-10-CM

## 2019-05-19 DIAGNOSIS — G8929 Other chronic pain: Secondary | ICD-10-CM

## 2019-06-16 ENCOUNTER — Other Ambulatory Visit: Payer: Self-pay | Admitting: *Deleted

## 2019-06-16 DIAGNOSIS — G8929 Other chronic pain: Secondary | ICD-10-CM

## 2019-06-16 MED ORDER — HYDROCODONE-ACETAMINOPHEN 7.5-325 MG PO TABS: ORAL_TABLET | ORAL | 0 refills | Status: DC

## 2019-07-14 ENCOUNTER — Other Ambulatory Visit: Payer: Self-pay | Admitting: *Deleted

## 2019-07-14 ENCOUNTER — Other Ambulatory Visit: Payer: Self-pay | Admitting: Family Medicine

## 2019-07-14 DIAGNOSIS — G8929 Other chronic pain: Secondary | ICD-10-CM

## 2019-07-14 MED ORDER — TRIAMCINOLONE ACETONIDE 0.5 % EX CREA: 1.0000 "application " | TOPICAL_CREAM | Freq: Every day | CUTANEOUS | 1 refills | Status: DC | PRN

## 2019-07-14 MED ORDER — HYDROCODONE-ACETAMINOPHEN 7.5-325 MG PO TABS: ORAL_TABLET | ORAL | 0 refills | Status: DC

## 2019-08-10 ENCOUNTER — Telehealth: Payer: Self-pay | Admitting: *Deleted

## 2019-08-10 DIAGNOSIS — E7800 Pure hypercholesterolemia, unspecified: Secondary | ICD-10-CM

## 2019-08-10 MED ORDER — ROSUVASTATIN CALCIUM 10 MG PO TABS: 10.0000 mg | ORAL_TABLET | ORAL | 3 refills | Status: DC

## 2019-08-10 NOTE — Telephone Encounter (Signed)
Pt calls because she does not want to take the lipitor anymore.  She states that it made her "foggy and unable to concentrate".  She would like for [place] to call [employer] and have it canceled since she received blister packs from them.  Dr. Andria Frames, I will be happy to call but wanted to check with you first.  Christen Bame, CMA

## 2019-08-10 NOTE — Telephone Encounter (Signed)
Called patient.  She will decrease her dose to every other day.  She is primary prevention but with an LDL=200, needs a statin.

## 2019-08-16 ENCOUNTER — Other Ambulatory Visit: Payer: Self-pay | Admitting: *Deleted

## 2019-08-16 DIAGNOSIS — G8929 Other chronic pain: Secondary | ICD-10-CM

## 2019-08-16 MED ORDER — HYDROCODONE-ACETAMINOPHEN 7.5-325 MG PO TABS: ORAL_TABLET | ORAL | 0 refills | Status: DC

## 2019-09-14 ENCOUNTER — Other Ambulatory Visit: Payer: Self-pay | Admitting: *Deleted

## 2019-09-14 DIAGNOSIS — G8929 Other chronic pain: Secondary | ICD-10-CM

## 2019-09-14 MED ORDER — HYDROCODONE-ACETAMINOPHEN 7.5-325 MG PO TABS: ORAL_TABLET | ORAL | 0 refills | Status: DC

## 2019-10-11 ENCOUNTER — Other Ambulatory Visit: Payer: Self-pay

## 2019-10-11 ENCOUNTER — Other Ambulatory Visit: Payer: Self-pay | Admitting: Family Medicine

## 2019-10-11 DIAGNOSIS — I10 Essential (primary) hypertension: Secondary | ICD-10-CM

## 2019-10-11 DIAGNOSIS — G8929 Other chronic pain: Secondary | ICD-10-CM

## 2019-10-11 MED ORDER — HYDROCODONE-ACETAMINOPHEN 7.5-325 MG PO TABS: ORAL_TABLET | ORAL | 0 refills | Status: DC

## 2019-11-09 ENCOUNTER — Other Ambulatory Visit: Payer: Self-pay | Admitting: Family Medicine

## 2019-11-09 DIAGNOSIS — R4589 Other symptoms and signs involving emotional state: Secondary | ICD-10-CM

## 2019-11-15 ENCOUNTER — Other Ambulatory Visit: Payer: Self-pay

## 2019-11-15 DIAGNOSIS — G8929 Other chronic pain: Secondary | ICD-10-CM

## 2019-11-15 MED ORDER — HYDROCODONE-ACETAMINOPHEN 7.5-325 MG PO TABS: ORAL_TABLET | ORAL | 0 refills | Status: DC

## 2019-12-09 ENCOUNTER — Other Ambulatory Visit: Payer: Self-pay | Admitting: Family Medicine

## 2019-12-09 DIAGNOSIS — R4589 Other symptoms and signs involving emotional state: Secondary | ICD-10-CM

## 2019-12-15 ENCOUNTER — Other Ambulatory Visit: Payer: Self-pay

## 2019-12-15 DIAGNOSIS — G8929 Other chronic pain: Secondary | ICD-10-CM

## 2019-12-15 MED ORDER — HYDROCODONE-ACETAMINOPHEN 7.5-325 MG PO TABS: ORAL_TABLET | ORAL | 0 refills | Status: DC

## 2020-01-13 ENCOUNTER — Other Ambulatory Visit: Payer: Self-pay

## 2020-01-13 DIAGNOSIS — G8929 Other chronic pain: Secondary | ICD-10-CM

## 2020-01-13 MED ORDER — HYDROCODONE-ACETAMINOPHEN 7.5-325 MG PO TABS: ORAL_TABLET | ORAL | 0 refills | Status: DC

## 2020-02-09 ENCOUNTER — Other Ambulatory Visit: Payer: Self-pay | Admitting: Family Medicine

## 2020-02-09 DIAGNOSIS — E7800 Pure hypercholesterolemia, unspecified: Secondary | ICD-10-CM

## 2020-02-10 ENCOUNTER — Other Ambulatory Visit: Payer: Self-pay | Admitting: *Deleted

## 2020-02-10 DIAGNOSIS — G8929 Other chronic pain: Secondary | ICD-10-CM

## 2020-02-10 MED ORDER — HYDROCODONE-ACETAMINOPHEN 7.5-325 MG PO TABS: ORAL_TABLET | ORAL | 0 refills | Status: DC

## 2020-02-10 NOTE — Telephone Encounter (Signed)
Pt calling for a refill on hydrocodone and also wants the dr to know that her and dale are on the list to get their vaccine. Sebasthian Stailey Kennon Holter, CMA

## 2020-03-13 ENCOUNTER — Other Ambulatory Visit: Payer: Self-pay

## 2020-03-13 DIAGNOSIS — G8929 Other chronic pain: Secondary | ICD-10-CM

## 2020-03-13 MED ORDER — HYDROCODONE-ACETAMINOPHEN 7.5-325 MG PO TABS: ORAL_TABLET | ORAL | 0 refills | Status: DC

## 2020-04-12 ENCOUNTER — Other Ambulatory Visit: Payer: Self-pay

## 2020-04-12 DIAGNOSIS — G8929 Other chronic pain: Secondary | ICD-10-CM

## 2020-04-14 MED ORDER — HYDROCODONE-ACETAMINOPHEN 7.5-325 MG PO TABS: ORAL_TABLET | ORAL | 0 refills | Status: DC

## 2020-05-12 ENCOUNTER — Other Ambulatory Visit: Payer: Self-pay | Admitting: *Deleted

## 2020-05-12 DIAGNOSIS — G8929 Other chronic pain: Secondary | ICD-10-CM

## 2020-05-12 MED ORDER — HYDROCODONE-ACETAMINOPHEN 7.5-325 MG PO TABS: ORAL_TABLET | ORAL | 0 refills | Status: DC

## 2020-06-13 ENCOUNTER — Other Ambulatory Visit: Payer: Self-pay

## 2020-06-13 DIAGNOSIS — G8929 Other chronic pain: Secondary | ICD-10-CM

## 2020-06-13 MED ORDER — HYDROCODONE-ACETAMINOPHEN 7.5-325 MG PO TABS: ORAL_TABLET | ORAL | 0 refills | Status: DC

## 2020-07-12 ENCOUNTER — Other Ambulatory Visit: Payer: Self-pay

## 2020-07-12 DIAGNOSIS — G8929 Other chronic pain: Secondary | ICD-10-CM

## 2020-07-12 MED ORDER — HYDROCODONE-ACETAMINOPHEN 7.5-325 MG PO TABS: ORAL_TABLET | ORAL | 0 refills | Status: DC

## 2020-07-20 ENCOUNTER — Other Ambulatory Visit: Payer: Self-pay

## 2020-08-11 ENCOUNTER — Other Ambulatory Visit: Payer: Self-pay

## 2020-08-11 DIAGNOSIS — G8929 Other chronic pain: Secondary | ICD-10-CM

## 2020-08-11 MED ORDER — HYDROCODONE-ACETAMINOPHEN 7.5-325 MG PO TABS: ORAL_TABLET | ORAL | 0 refills | Status: DC

## 2020-09-12 ENCOUNTER — Other Ambulatory Visit: Payer: Self-pay

## 2020-09-12 DIAGNOSIS — G8929 Other chronic pain: Secondary | ICD-10-CM

## 2020-09-12 MED ORDER — HYDROCODONE-ACETAMINOPHEN 7.5-325 MG PO TABS: ORAL_TABLET | ORAL | 0 refills | Status: DC

## 2020-10-16 ENCOUNTER — Other Ambulatory Visit: Payer: Self-pay

## 2020-10-16 DIAGNOSIS — G8929 Other chronic pain: Secondary | ICD-10-CM

## 2020-10-16 MED ORDER — HYDROCODONE-ACETAMINOPHEN 7.5-325 MG PO TABS: ORAL_TABLET | ORAL | 0 refills | Status: DC

## 2020-10-17 ENCOUNTER — Other Ambulatory Visit: Payer: Self-pay | Admitting: Family Medicine

## 2020-10-17 DIAGNOSIS — G8929 Other chronic pain: Secondary | ICD-10-CM

## 2020-11-06 ENCOUNTER — Other Ambulatory Visit: Payer: Self-pay | Admitting: Family Medicine

## 2020-11-06 DIAGNOSIS — I10 Essential (primary) hypertension: Secondary | ICD-10-CM

## 2020-11-08 ENCOUNTER — Other Ambulatory Visit: Payer: Self-pay

## 2020-11-08 DIAGNOSIS — G8929 Other chronic pain: Secondary | ICD-10-CM

## 2020-11-08 MED ORDER — HYDROCODONE-ACETAMINOPHEN 7.5-325 MG PO TABS: ORAL_TABLET | ORAL | 0 refills | Status: DC

## 2020-11-16 ENCOUNTER — Other Ambulatory Visit: Payer: Self-pay | Admitting: Family Medicine

## 2020-12-07 ENCOUNTER — Other Ambulatory Visit: Payer: Self-pay | Admitting: Family Medicine

## 2020-12-07 DIAGNOSIS — E7800 Pure hypercholesterolemia, unspecified: Secondary | ICD-10-CM

## 2020-12-07 DIAGNOSIS — R4589 Other symptoms and signs involving emotional state: Secondary | ICD-10-CM

## 2020-12-14 ENCOUNTER — Other Ambulatory Visit: Payer: Self-pay

## 2020-12-14 DIAGNOSIS — G8929 Other chronic pain: Secondary | ICD-10-CM

## 2020-12-14 MED ORDER — HYDROCODONE-ACETAMINOPHEN 7.5-325 MG PO TABS: ORAL_TABLET | ORAL | 0 refills | Status: DC

## 2021-01-11 ENCOUNTER — Other Ambulatory Visit: Payer: Self-pay

## 2021-01-11 DIAGNOSIS — G8929 Other chronic pain: Secondary | ICD-10-CM

## 2021-01-11 MED ORDER — HYDROCODONE-ACETAMINOPHEN 7.5-325 MG PO TABS: ORAL_TABLET | ORAL | 0 refills | Status: DC

## 2021-02-13 ENCOUNTER — Other Ambulatory Visit: Payer: Self-pay

## 2021-02-13 DIAGNOSIS — G8929 Other chronic pain: Secondary | ICD-10-CM

## 2021-02-13 MED ORDER — HYDROCODONE-ACETAMINOPHEN 7.5-325 MG PO TABS: ORAL_TABLET | ORAL | 0 refills | Status: DC

## 2021-03-13 ENCOUNTER — Other Ambulatory Visit: Payer: Self-pay

## 2021-03-13 DIAGNOSIS — G8929 Other chronic pain: Secondary | ICD-10-CM

## 2021-03-13 MED ORDER — HYDROCODONE-ACETAMINOPHEN 7.5-325 MG PO TABS: ORAL_TABLET | ORAL | 0 refills | Status: DC

## 2021-04-11 ENCOUNTER — Other Ambulatory Visit: Payer: Self-pay

## 2021-04-11 DIAGNOSIS — G8929 Other chronic pain: Secondary | ICD-10-CM

## 2021-04-11 MED ORDER — HYDROCODONE-ACETAMINOPHEN 7.5-325 MG PO TABS: ORAL_TABLET | ORAL | 0 refills | Status: DC

## 2021-05-15 ENCOUNTER — Other Ambulatory Visit: Payer: Self-pay

## 2021-05-15 DIAGNOSIS — G8929 Other chronic pain: Secondary | ICD-10-CM

## 2021-05-15 MED ORDER — HYDROCODONE-ACETAMINOPHEN 7.5-325 MG PO TABS: ORAL_TABLET | ORAL | 0 refills | Status: DC

## 2021-06-12 ENCOUNTER — Other Ambulatory Visit: Payer: Self-pay

## 2021-06-12 DIAGNOSIS — G8929 Other chronic pain: Secondary | ICD-10-CM

## 2021-06-13 MED ORDER — HYDROCODONE-ACETAMINOPHEN 7.5-325 MG PO TABS: ORAL_TABLET | ORAL | 0 refills | Status: DC

## 2021-07-12 ENCOUNTER — Other Ambulatory Visit: Payer: Self-pay

## 2021-07-12 DIAGNOSIS — G8929 Other chronic pain: Secondary | ICD-10-CM

## 2021-07-13 MED ORDER — HYDROCODONE-ACETAMINOPHEN 7.5-325 MG PO TABS: ORAL_TABLET | ORAL | 0 refills | Status: DC

## 2021-08-13 ENCOUNTER — Other Ambulatory Visit: Payer: Self-pay

## 2021-08-13 DIAGNOSIS — G8929 Other chronic pain: Secondary | ICD-10-CM

## 2021-08-13 MED ORDER — HYDROCODONE-ACETAMINOPHEN 7.5-325 MG PO TABS: ORAL_TABLET | ORAL | 0 refills | Status: DC

## 2021-09-14 ENCOUNTER — Other Ambulatory Visit: Payer: Self-pay

## 2021-09-14 DIAGNOSIS — G8929 Other chronic pain: Secondary | ICD-10-CM

## 2021-09-17 MED ORDER — HYDROCODONE-ACETAMINOPHEN 7.5-325 MG PO TABS: ORAL_TABLET | ORAL | 0 refills | Status: DC

## 2021-10-10 ENCOUNTER — Other Ambulatory Visit: Payer: Self-pay

## 2021-10-10 DIAGNOSIS — G8929 Other chronic pain: Secondary | ICD-10-CM

## 2021-10-10 MED ORDER — HYDROCODONE-ACETAMINOPHEN 7.5-325 MG PO TABS: ORAL_TABLET | ORAL | 0 refills | Status: DC

## 2021-11-06 ENCOUNTER — Other Ambulatory Visit: Payer: Self-pay

## 2021-11-06 DIAGNOSIS — G8929 Other chronic pain: Secondary | ICD-10-CM

## 2021-11-06 MED ORDER — HYDROCODONE-ACETAMINOPHEN 7.5-325 MG PO TABS: ORAL_TABLET | ORAL | 0 refills | Status: DC

## 2021-12-04 ENCOUNTER — Other Ambulatory Visit: Payer: Self-pay | Admitting: Family Medicine

## 2021-12-04 DIAGNOSIS — I10 Essential (primary) hypertension: Secondary | ICD-10-CM

## 2021-12-04 DIAGNOSIS — R4589 Other symptoms and signs involving emotional state: Secondary | ICD-10-CM

## 2021-12-04 DIAGNOSIS — E7800 Pure hypercholesterolemia, unspecified: Secondary | ICD-10-CM

## 2021-12-13 ENCOUNTER — Other Ambulatory Visit: Payer: Self-pay

## 2021-12-13 DIAGNOSIS — G8929 Other chronic pain: Secondary | ICD-10-CM

## 2021-12-13 MED ORDER — HYDROCODONE-ACETAMINOPHEN 7.5-325 MG PO TABS: ORAL_TABLET | ORAL | 0 refills | Status: DC

## 2022-01-11 ENCOUNTER — Other Ambulatory Visit: Payer: Self-pay

## 2022-01-11 DIAGNOSIS — G8929 Other chronic pain: Secondary | ICD-10-CM

## 2022-01-11 MED ORDER — HYDROCODONE-ACETAMINOPHEN 7.5-325 MG PO TABS: ORAL_TABLET | ORAL | 0 refills | Status: DC

## 2022-02-12 ENCOUNTER — Other Ambulatory Visit: Payer: Self-pay

## 2022-02-12 DIAGNOSIS — G8929 Other chronic pain: Secondary | ICD-10-CM

## 2022-02-12 MED ORDER — HYDROCODONE-ACETAMINOPHEN 7.5-325 MG PO TABS: ORAL_TABLET | ORAL | 0 refills | Status: DC

## 2022-03-13 ENCOUNTER — Other Ambulatory Visit: Payer: Self-pay

## 2022-03-13 DIAGNOSIS — G8929 Other chronic pain: Secondary | ICD-10-CM

## 2022-03-13 MED ORDER — HYDROCODONE-ACETAMINOPHEN 7.5-325 MG PO TABS: ORAL_TABLET | ORAL | 0 refills | Status: DC

## 2022-04-08 ENCOUNTER — Encounter: Payer: Self-pay | Admitting: Internal Medicine

## 2022-04-11 ENCOUNTER — Other Ambulatory Visit: Payer: Self-pay | Admitting: *Deleted

## 2022-04-11 DIAGNOSIS — G8929 Other chronic pain: Secondary | ICD-10-CM

## 2022-04-11 MED ORDER — HYDROCODONE-ACETAMINOPHEN 7.5-325 MG PO TABS: ORAL_TABLET | ORAL | 0 refills | Status: DC

## 2022-04-23 ENCOUNTER — Encounter: Payer: Self-pay | Admitting: *Deleted

## 2022-05-14 ENCOUNTER — Other Ambulatory Visit: Payer: Self-pay

## 2022-05-14 DIAGNOSIS — G8929 Other chronic pain: Secondary | ICD-10-CM

## 2022-05-14 MED ORDER — HYDROCODONE-ACETAMINOPHEN 7.5-325 MG PO TABS: ORAL_TABLET | ORAL | 0 refills | Status: DC

## 2022-06-12 ENCOUNTER — Telehealth: Payer: Self-pay

## 2022-06-12 DIAGNOSIS — G8929 Other chronic pain: Secondary | ICD-10-CM

## 2022-06-12 MED ORDER — HYDROCODONE-ACETAMINOPHEN 7.5-325 MG PO TABS: ORAL_TABLET | ORAL | 0 refills | Status: DC

## 2022-06-12 NOTE — Telephone Encounter (Signed)
Patient calls nurse line requesting a refill on Hydrocodone.   Patient is requesting to up dosage. Patient reports she has been on 7.'5mg'$  for a "while now" and they are not lasting the full 8 hours anymore.   Will forward to PCP.

## 2022-06-12 NOTE — Telephone Encounter (Signed)
Called patient.  Yes, on chronic low dose narcotic.  I am dismayed that on chart review, I have not seen her in three years.  She attributes to COVID.  I informed her that I will give her a one month refill and that she must see me in that month.  She understands. Also, attempted x 2 to review PDMP today.  Link not working.

## 2022-06-24 ENCOUNTER — Encounter: Payer: Self-pay | Admitting: Family Medicine

## 2022-06-24 ENCOUNTER — Ambulatory Visit (INDEPENDENT_AMBULATORY_CARE_PROVIDER_SITE_OTHER): Payer: PPO | Admitting: Family Medicine

## 2022-06-24 DIAGNOSIS — G8929 Other chronic pain: Secondary | ICD-10-CM

## 2022-06-24 DIAGNOSIS — I10 Essential (primary) hypertension: Secondary | ICD-10-CM | POA: Diagnosis not present

## 2022-06-24 DIAGNOSIS — H9319 Tinnitus, unspecified ear: Secondary | ICD-10-CM | POA: Insufficient documentation

## 2022-06-24 DIAGNOSIS — R7401 Elevation of levels of liver transaminase levels: Secondary | ICD-10-CM | POA: Diagnosis not present

## 2022-06-24 DIAGNOSIS — E7800 Pure hypercholesterolemia, unspecified: Secondary | ICD-10-CM | POA: Diagnosis not present

## 2022-06-24 DIAGNOSIS — H9311 Tinnitus, right ear: Secondary | ICD-10-CM

## 2022-06-24 DIAGNOSIS — I739 Peripheral vascular disease, unspecified: Secondary | ICD-10-CM

## 2022-06-24 DIAGNOSIS — F411 Generalized anxiety disorder: Secondary | ICD-10-CM | POA: Diagnosis not present

## 2022-06-24 DIAGNOSIS — K432 Incisional hernia without obstruction or gangrene: Secondary | ICD-10-CM | POA: Diagnosis not present

## 2022-06-24 MED ORDER — TRIAMCINOLONE ACETONIDE 0.5 % EX CREA: TOPICAL_CREAM | CUTANEOUS | 6 refills | Status: DC

## 2022-06-24 MED ORDER — ROSUVASTATIN CALCIUM 10 MG PO TABS: 10.0000 mg | ORAL_TABLET | ORAL | Status: DC

## 2022-06-24 MED ORDER — GABAPENTIN 100 MG PO CAPS: 100.0000 mg | ORAL_CAPSULE | Freq: Three times a day (TID) | ORAL | 3 refills | Status: DC

## 2022-06-24 NOTE — Assessment & Plan Note (Signed)
Trial of neurotin, which may also help anxiety and sleep.

## 2022-06-24 NOTE — Assessment & Plan Note (Signed)
Asks about continuing to delay surgery.  Discussed incarceration as the only urgent indication.

## 2022-06-24 NOTE — Assessment & Plan Note (Signed)
Restart aspirin   Continue statin.  Check lipids.

## 2022-06-24 NOTE — Assessment & Plan Note (Signed)
Maybe we'll get lucky and gabapentin will help anxiety.  Continue sertraline.

## 2022-06-24 NOTE — Assessment & Plan Note (Signed)
No further WU unless also has hearing loss.

## 2022-06-24 NOTE — Progress Notes (Signed)
    SUBJECTIVE:   CHIEF COMPLAINT / HPI:   First visit in three years.  She went into isolation with COVID and has not come out of it.  Issues Chronic bilateral leg pain.  Worse.  On steady dose of narcotics.  Asks if she can increase the dose.  Explained change in practice for not using narcotics for non terminal pain.  I do not plan to wean, but I also do not plan to increase.  She has not tried neuropathic pain meds Anxiety is worse.  On sertraline.  Anxiety is now one reason why she stays at home.  Sleeping poorly.  Using benadryl at night which I do not recommend.   Tinnitis right ear.  No hearing loss.  No vertigo.  Already using background noise at night to distract. S/P dental extrations.  Has changed diet to mostly soft foods.  No significant weight loss. High cholesterol.  Also has PVD so this is secondary prevention. PVD, quit taking ASA.  Because of secondary prevention, recommended that she restart. HPDP.  Of course, behind on several screenings.  Declines mammo.  She agrees to blood work today.    OBJECTIVE:   BP 136/71   Pulse 74   Ht '5\' 5"'$  (1.651 m)   Wt 195 lb 12.8 oz (88.8 kg)   SpO2 100%   BMI 32.58 kg/m   Lungs clear Cardiac RRR without m or g Ext 2+ edema.  Marked telangectasias both legs.  Good pulses.  ASSESSMENT/PLAN:   Encounter for chronic pain management Trial of neurotin, which may also help anxiety and sleep.  HYPERCHOLESTEROLEMIA Check lipids.  HYPERTENSION, BENIGN SYSTEMIC Good control.    Tinnitus No further WU unless also has hearing loss.  Incisional hernia, without obstruction or gangrene Asks about continuing to delay surgery.  Discussed incarceration as the only urgent indication.  Generalized anxiety disorder Maybe we'll get lucky and gabapentin will help anxiety.  Continue sertraline.  Peripheral vascular disease, unspecified ([place]) Restart aspirin   Continue statin.  Check lipids.     Zenia Resides, MD [place]

## 2022-06-24 NOTE — Patient Instructions (Signed)
You are doing OK I still recommend a mammogram. Go back on the aspirin every day Stop the benadryl We will play around with the gabapentin.  I hope it is a good drug for you and that we find a dose that helps your legs, maybe your anxiety and maybe the sleep. Call me in one month and we can plan to adjust the dose of the gabapentin.

## 2022-06-24 NOTE — Assessment & Plan Note (Signed)
Check lipids 

## 2022-06-24 NOTE — Assessment & Plan Note (Signed)
Good control

## 2022-06-25 LAB — CBC
Hematocrit: 44.6 % (ref 34.0–46.6)
Hemoglobin: 14.8 g/dL (ref 11.1–15.9)
MCH: 27.1 pg (ref 26.6–33.0)
MCHC: 33.2 g/dL (ref 31.5–35.7)
MCV: 82 fL (ref 79–97)
Platelets: 208 10*3/uL (ref 150–450)
RBC: 5.46 x10E6/uL — ABNORMAL HIGH (ref 3.77–5.28)
RDW: 15 % (ref 11.7–15.4)
WBC: 9.1 10*3/uL (ref 3.4–10.8)

## 2022-06-25 LAB — CMP14+EGFR
ALT: 211 IU/L — ABNORMAL HIGH (ref 0–32)
AST: 139 IU/L — ABNORMAL HIGH (ref 0–40)
Albumin/Globulin Ratio: 1.5 (ref 1.2–2.2)
Albumin: 4.8 g/dL (ref 3.9–4.9)
Alkaline Phosphatase: 426 IU/L — ABNORMAL HIGH (ref 44–121)
BUN/Creatinine Ratio: 20 (ref 12–28)
BUN: 25 mg/dL (ref 8–27)
Bilirubin Total: 0.5 mg/dL (ref 0.0–1.2)
CO2: 23 mmol/L (ref 20–29)
Calcium: 10.2 mg/dL (ref 8.7–10.3)
Chloride: 98 mmol/L (ref 96–106)
Creatinine, Ser: 1.25 mg/dL — ABNORMAL HIGH (ref 0.57–1.00)
Globulin, Total: 3.3 g/dL (ref 1.5–4.5)
Glucose: 91 mg/dL (ref 70–99)
Potassium: 4.6 mmol/L (ref 3.5–5.2)
Sodium: 140 mmol/L (ref 134–144)
Total Protein: 8.1 g/dL (ref 6.0–8.5)
eGFR: 46 mL/min/{1.73_m2} — ABNORMAL LOW (ref >59)

## 2022-06-25 LAB — LIPID PANEL
Chol/HDL Ratio: 2.6 ratio (ref 0.0–4.4)
Cholesterol, Total: 167 mg/dL (ref 100–199)
HDL: 65 mg/dL (ref >39)
LDL Chol Calc (NIH): 79 mg/dL (ref 0–99)
Triglycerides: 133 mg/dL (ref 0–149)
VLDL Cholesterol Cal: 23 mg/dL (ref 5–40)

## 2022-06-25 LAB — TSH: TSH: 1.79 u[IU]/mL (ref 0.450–4.500)

## 2022-07-01 DIAGNOSIS — R7401 Elevation of levels of liver transaminase levels: Secondary | ICD-10-CM | POA: Insufficient documentation

## 2022-07-01 NOTE — Assessment & Plan Note (Signed)
Incidental finding on labs.  No drug or alcohol cause.  S/P cholecystectomy.  CBD stone?  Will check RUQ ultrasound

## 2022-07-01 NOTE — Addendum Note (Signed)
Addended by: Zenia Resides on: 07/01/2022 04:58 PM   Modules accepted: Orders

## 2022-07-05 ENCOUNTER — Ambulatory Visit
Admission: RE | Admit: 2022-07-05 | Discharge: 2022-07-05 | Disposition: A | Discharge status: Home / Self Care | Payer: PPO | Source: Ambulatory Visit | Attending: Family Medicine | Admitting: Family Medicine

## 2022-07-05 DIAGNOSIS — R748 Abnormal levels of other serum enzymes: Secondary | ICD-10-CM | POA: Diagnosis not present

## 2022-07-05 DIAGNOSIS — Z9049 Acquired absence of other specified parts of digestive tract: Secondary | ICD-10-CM | POA: Diagnosis not present

## 2022-07-05 DIAGNOSIS — R7401 Elevation of levels of liver transaminase levels: Secondary | ICD-10-CM

## 2022-07-15 ENCOUNTER — Other Ambulatory Visit: Payer: Self-pay

## 2022-07-15 DIAGNOSIS — G8929 Other chronic pain: Secondary | ICD-10-CM

## 2022-07-15 MED ORDER — HYDROCODONE-ACETAMINOPHEN 7.5-325 MG PO TABS: ORAL_TABLET | ORAL | 0 refills | Status: DC

## 2022-07-31 ENCOUNTER — Encounter: Payer: Self-pay | Admitting: Family Medicine

## 2022-07-31 ENCOUNTER — Ambulatory Visit (INDEPENDENT_AMBULATORY_CARE_PROVIDER_SITE_OTHER): Payer: PPO | Admitting: Family Medicine

## 2022-07-31 DIAGNOSIS — E7800 Pure hypercholesterolemia, unspecified: Secondary | ICD-10-CM

## 2022-07-31 DIAGNOSIS — I10 Essential (primary) hypertension: Secondary | ICD-10-CM | POA: Diagnosis not present

## 2022-07-31 DIAGNOSIS — R7401 Elevation of levels of liver transaminase levels: Secondary | ICD-10-CM | POA: Diagnosis not present

## 2022-07-31 DIAGNOSIS — N1831 Chronic kidney disease, stage 3a: Secondary | ICD-10-CM

## 2022-07-31 DIAGNOSIS — I739 Peripheral vascular disease, unspecified: Secondary | ICD-10-CM | POA: Diagnosis not present

## 2022-07-31 NOTE — Patient Instructions (Signed)
I will call with results of the blood tests.  What we do next depends on those results.   When I call, I will talk about both the liver and the kidneys.  I believe these are two separate, early problems.

## 2022-07-31 NOTE — Assessment & Plan Note (Signed)
Secondary prevention.

## 2022-07-31 NOTE — Assessment & Plan Note (Signed)
Will repeat Hep C and B screen.  Repeat CMP.  If still elevated, may need MRCP to definitively RO CBD stone.

## 2022-07-31 NOTE — Assessment & Plan Note (Signed)
Likely this is CKD.  Recheck creat. Check urine microalbumin.  Focus on good control of HBP.  Consider SGLT2.

## 2022-07-31 NOTE — Progress Notes (Signed)
    SUBJECTIVE:   CHIEF COMPLAINT / HPI:   FU two problems found on blood work after 2 year COVID hiatus. Transaminitis.  No high risk meds.  Old hepatitis screening 2009 all neg.  No high risk behavior.  Patient S/P cholecystectomy.  Ultrasound no mass or CBD stone.  High echogenicity suggests fatty liver.  Healthy diet.  No alcohol or other drugs.  No recent weight gain.  No abd pain. Modest elevation of creat.  Likely CKD.  Not dehydrated when drawn.  No NSAID use.      OBJECTIVE:   BP 128/72   Pulse 70   Ht '5\' 5"'$  (1.651 m)   Wt 196 lb 6.4 oz (89.1 kg)   SpO2 98%   BMI 32.68 kg/m   Lungs clear Cardiac RRR without m or g Abd benign.  ASSESSMENT/PLAN:   Stage 3a chronic kidney disease (CKD) ([place]) Likely this is CKD.  Recheck creat. Check urine microalbumin.  Focus on good control of HBP.  Consider SGLT2.  Transaminitis Will repeat Hep C and B screen.  Repeat CMP.  If still elevated, may need MRCP to definitively RO CBD stone.    Peripheral vascular disease, unspecified ([place]) Secondary prevention.     Zenia Resides, MD [place]

## 2022-08-01 LAB — CMP14+EGFR
ALT: 74 IU/L — ABNORMAL HIGH (ref 0–32)
AST: 70 IU/L — ABNORMAL HIGH (ref 0–40)
Albumin/Globulin Ratio: 1.4 (ref 1.2–2.2)
Albumin: 4.3 g/dL (ref 3.9–4.9)
Alkaline Phosphatase: 754 IU/L — ABNORMAL HIGH (ref 44–121)
BUN/Creatinine Ratio: 17 (ref 12–28)
BUN: 20 mg/dL (ref 8–27)
Bilirubin Total: 0.4 mg/dL (ref 0.0–1.2)
CO2: 23 mmol/L (ref 20–29)
Calcium: 10.2 mg/dL (ref 8.7–10.3)
Chloride: 102 mmol/L (ref 96–106)
Creatinine, Ser: 1.18 mg/dL — ABNORMAL HIGH (ref 0.57–1.00)
Globulin, Total: 3 g/dL (ref 1.5–4.5)
Glucose: 94 mg/dL (ref 70–99)
Potassium: 5.3 mmol/L — ABNORMAL HIGH (ref 3.5–5.2)
Sodium: 142 mmol/L (ref 134–144)
Total Protein: 7.3 g/dL (ref 6.0–8.5)
eGFR: 50 mL/min/{1.73_m2} — ABNORMAL LOW (ref >59)

## 2022-08-01 LAB — MICROALBUMIN / CREATININE URINE RATIO
Creatinine, Urine: 94.1 mg/dL
Microalb/Creat Ratio: 33 mg/g creat — ABNORMAL HIGH (ref 0–29)
Microalbumin, Urine: 31.5 ug/mL

## 2022-08-01 LAB — HEPATITIS C ANTIBODY: Hep C Virus Ab: NONREACTIVE

## 2022-08-01 LAB — HEPATITIS B SURFACE ANTIGEN: Hepatitis B Surface Ag: NEGATIVE

## 2022-08-01 NOTE — Addendum Note (Signed)
Addended by: Zenia Resides on: 08/01/2022 03:39 PM   Modules accepted: Orders

## 2022-08-02 ENCOUNTER — Telehealth: Payer: Self-pay

## 2022-08-02 NOTE — Telephone Encounter (Signed)
Patient calls nurse line regarding referral for colonoscopy. She is asking if referral can be placed in the [place] area.  Will forward to referral coordinator.   Talbot Grumbling, RN

## 2022-08-05 NOTE — Telephone Encounter (Signed)
Referral sent to [facility].  They will call patient about an appt.  Doren Kaspar,CMA

## 2022-08-06 ENCOUNTER — Encounter: Payer: Self-pay | Admitting: *Deleted

## 2022-08-06 ENCOUNTER — Ambulatory Visit: Payer: PPO | Admitting: Gastroenterology

## 2022-08-13 ENCOUNTER — Other Ambulatory Visit: Payer: Self-pay

## 2022-08-13 DIAGNOSIS — G8929 Other chronic pain: Secondary | ICD-10-CM

## 2022-08-13 MED ORDER — HYDROCODONE-ACETAMINOPHEN 7.5-325 MG PO TABS: ORAL_TABLET | ORAL | 0 refills | Status: DC

## 2022-08-23 ENCOUNTER — Ambulatory Visit: Payer: PPO | Admitting: Gastroenterology

## 2022-08-26 ENCOUNTER — Telehealth: Payer: Self-pay | Admitting: *Deleted

## 2022-08-26 ENCOUNTER — Encounter: Payer: Self-pay | Admitting: Gastroenterology

## 2022-08-26 ENCOUNTER — Ambulatory Visit (INDEPENDENT_AMBULATORY_CARE_PROVIDER_SITE_OTHER): Payer: PPO | Admitting: Gastroenterology

## 2022-08-26 VITALS — BP 115/70 | HR 85 | Temp 97.2°F | Ht 66.0 in | Wt 172.2 lb

## 2022-08-26 DIAGNOSIS — R7401 Elevation of levels of liver transaminase levels: Secondary | ICD-10-CM | POA: Diagnosis not present

## 2022-08-26 DIAGNOSIS — K838 Other specified diseases of biliary tract: Secondary | ICD-10-CM | POA: Diagnosis not present

## 2022-08-26 DIAGNOSIS — R748 Abnormal levels of other serum enzymes: Secondary | ICD-10-CM

## 2022-08-26 DIAGNOSIS — Z86010 Personal history of colonic polyps: Secondary | ICD-10-CM | POA: Diagnosis not present

## 2022-08-26 NOTE — Telephone Encounter (Signed)
MRI/MRCP scheduled for 09/17/22 at 9 am , arrive at 8:30 am. Nothing to eat or drink 4 hours prior.   [place]

## 2022-08-26 NOTE — Patient Instructions (Signed)
I am ordering an MRCP for you have completed at [facility].  Our scheduler will reach out to you with the date and time.  I am ordering labs for you have completed at [facility], this is across the street from the [facility] emergency department in the two-story building.  It is located on the second floor.  Your ultrasound noted possible hepatic steatosis (fatty liver).  Instructions for fatty liver: Recommend 1-2# weight loss per week until ideal body weight through exercise & diet. Low fat/cholesterol diet.   Avoid sweets, sodas, fruit juices, sweetened beverages like tea, etc. Gradually increase exercise from 15 min daily up to 1 hr per day 5 days/week. Limit alcohol use.  I am also ordering Cologuard for you to complete.  As we discussed, this is positive, we would need to proceed with colonoscopy.  With your history of colon polyps, this could come back positive.  Please be mindful that there are false positives and false negatives.  The best way for [place] to evaluate for colon cancer screening is via colonoscopy.  I will be in touch with lab results and imaging results once received.  We will determine follow-up after this.  It was a pleasure to see you today. I want to create trusting relationships with patients. If you receive a survey regarding your visit,  I greatly appreciate you taking time to fill this out on paper or through your MyChart. I value your feedback.  Venetia Night, MSN, FNP-BC, AGACNP-BC [facility]
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
## 2022-08-26 NOTE — Telephone Encounter (Signed)
Pt aware of appt details. Provided central scheduling # to reschedule as she can't do this early in the morning

## 2022-09-16 ENCOUNTER — Other Ambulatory Visit: Payer: Self-pay

## 2022-09-16 DIAGNOSIS — G8929 Other chronic pain: Secondary | ICD-10-CM

## 2022-09-16 MED ORDER — HYDROCODONE-ACETAMINOPHEN 7.5-325 MG PO TABS: ORAL_TABLET | ORAL | 0 refills | Status: DC

## 2022-09-17 ENCOUNTER — Ambulatory Visit (HOSPITAL_COMMUNITY): Payer: PPO

## 2022-09-20 ENCOUNTER — Other Ambulatory Visit: Payer: Self-pay | Admitting: Gastroenterology

## 2022-09-20 ENCOUNTER — Ambulatory Visit (HOSPITAL_COMMUNITY)
Admission: RE | Admit: 2022-09-20 | Discharge: 2022-09-20 | Disposition: A | Discharge status: Home / Self Care | Payer: PPO | Source: Ambulatory Visit | Attending: Gastroenterology | Admitting: Gastroenterology

## 2022-09-20 DIAGNOSIS — K838 Other specified diseases of biliary tract: Secondary | ICD-10-CM | POA: Insufficient documentation

## 2022-09-20 DIAGNOSIS — R748 Abnormal levels of other serum enzymes: Secondary | ICD-10-CM | POA: Insufficient documentation

## 2022-09-20 DIAGNOSIS — K8050 Calculus of bile duct without cholangitis or cholecystitis without obstruction: Secondary | ICD-10-CM | POA: Diagnosis not present

## 2022-09-20 DIAGNOSIS — R7989 Other specified abnormal findings of blood chemistry: Secondary | ICD-10-CM | POA: Diagnosis not present

## 2022-09-20 DIAGNOSIS — K8689 Other specified diseases of pancreas: Secondary | ICD-10-CM | POA: Diagnosis not present

## 2022-09-20 DIAGNOSIS — R7401 Elevation of levels of liver transaminase levels: Secondary | ICD-10-CM | POA: Insufficient documentation

## 2022-09-20 MED ORDER — GADOBUTROL 1 MMOL/ML IV SOLN
7.5000 mL | Freq: Once | INTRAVENOUS | Status: AC | PRN
  Administered 2022-09-20: 7.5 mL via INTRAVENOUS

## 2022-09-23 ENCOUNTER — Other Ambulatory Visit: Payer: Self-pay | Admitting: *Deleted

## 2022-09-23 ENCOUNTER — Other Ambulatory Visit (INDEPENDENT_AMBULATORY_CARE_PROVIDER_SITE_OTHER): Payer: Self-pay | Admitting: *Deleted

## 2022-09-23 DIAGNOSIS — R748 Abnormal levels of other serum enzymes: Secondary | ICD-10-CM

## 2022-09-23 DIAGNOSIS — K838 Other specified diseases of biliary tract: Secondary | ICD-10-CM

## 2022-09-23 DIAGNOSIS — R7989 Other specified abnormal findings of blood chemistry: Secondary | ICD-10-CM

## 2022-09-26 ENCOUNTER — Telehealth: Payer: Self-pay

## 2022-09-26 NOTE — Telephone Encounter (Signed)
-----   Message from Irving Copas., MD sent at 09/26/2022  9:59 AM EST ----- CLM, No worries we will get her scheduled. If she were to develop any fevers or chills or progressive jaundice, she may end up needing to come to the emergency department at [facility] or [facility].  Keep me up-to-date if her liver test change.  Obryan Radu, Bobie Kistler, This patient has choledocholithiasis from the [facility].  Please proceed with scheduling ERCP with me next available which I know is in December.  Patient has stable liver test for now.  Patient is aware that if she were to develop fevers or chills or progressive jaundice she would need to come into the hospital at [facility] or [facility]. Thanks. GM  FYI biliary group, in case any of you have an earlier ERCP time slot for this individual who has choledocholithiasis please let us know so that she may be able to get as timely procedure as possible. Thanks. GM ----- Message ----- From: Sherron Monday, NP Sent: 09/26/2022   8:11 AM EST To: Irving Copas., MD  Yeah she has been stable other than a slight recent up tick in her labs. I think 3-4 weeks is great.  Thank you, Venetia Night, NP   ----- Message ----- From: Irving Copas., MD Sent: 09/26/2022   5:57 AM EST To: Sherron Monday, NP  CLM, Looks like she has been stable for quite awhile and that stone has certainly been there for years. I can get her in, in approximately 3-4 weeks.  I already know my other partners do not have availability currently either.  If you are OK with that, reply back and I'll get her scheduled.  Thanks. GM ----- Message ----- From: Sherron Monday, NP Sent: 09/25/2022   6:08 PM EST To: Irving Copas., MD  Hi Dr. Rush [facility],  I know you guys are slammed and short staffed right now. I have this patient with elevated LFTs and evidence of 1.8cm common bile duct stone. Essentially asymptomatic. I was wondering if you have any  availability for an ERCP or if I should attempt to get her in with [facility]? I went ahead and got the referral process started just wanted to check in.   Venetia Night, NP

## 2022-09-27 ENCOUNTER — Other Ambulatory Visit: Payer: Self-pay

## 2022-09-27 DIAGNOSIS — K838 Other specified diseases of biliary tract: Secondary | ICD-10-CM

## 2022-09-27 DIAGNOSIS — R7989 Other specified abnormal findings of blood chemistry: Secondary | ICD-10-CM

## 2022-09-27 DIAGNOSIS — K8050 Calculus of bile duct without cholangitis or cholecystitis without obstruction: Secondary | ICD-10-CM

## 2022-09-27 NOTE — Telephone Encounter (Signed)
ERCP scheduled for 10/31/22 at 9 am at [facility] with GM   Left message on machine to call back

## 2022-09-30 NOTE — Telephone Encounter (Signed)
Left message on machine to call back  

## 2022-10-01 NOTE — Telephone Encounter (Signed)
Line busy

## 2022-10-02 NOTE — Telephone Encounter (Signed)
ERCP scheduled, pt instructed and medications reviewed.  Patient instructions mailed to home and sent to My [facility].  Patient to call with any questions or concerns.

## 2022-10-03 ENCOUNTER — Other Ambulatory Visit (HOSPITAL_COMMUNITY)
Admission: RE | Admit: 2022-10-03 | Discharge: 2022-10-03 | Disposition: A | Discharge status: Home / Self Care | Payer: PPO | Source: Ambulatory Visit | Attending: Gastroenterology | Admitting: Gastroenterology

## 2022-10-03 DIAGNOSIS — R7989 Other specified abnormal findings of blood chemistry: Secondary | ICD-10-CM | POA: Insufficient documentation

## 2022-10-03 DIAGNOSIS — R748 Abnormal levels of other serum enzymes: Secondary | ICD-10-CM | POA: Insufficient documentation

## 2022-10-03 LAB — HEPATIC FUNCTION PANEL
ALT: 166 U/L — ABNORMAL HIGH (ref 0–44)
AST: 239 U/L — ABNORMAL HIGH (ref 15–41)
Albumin: 3.6 g/dL (ref 3.5–5.0)
Alkaline Phosphatase: 972 U/L — ABNORMAL HIGH (ref 38–126)
Bilirubin, Direct: 0.7 mg/dL — ABNORMAL HIGH (ref 0.0–0.2)
Indirect Bilirubin: 1 mg/dL — ABNORMAL HIGH (ref 0.3–0.9)
Total Bilirubin: 1.7 mg/dL — ABNORMAL HIGH (ref 0.3–1.2)
Total Protein: 7.9 g/dL (ref 6.5–8.1)

## 2022-10-15 ENCOUNTER — Encounter: Payer: Self-pay | Admitting: *Deleted

## 2022-10-15 ENCOUNTER — Other Ambulatory Visit: Payer: Self-pay

## 2022-10-15 DIAGNOSIS — G8929 Other chronic pain: Secondary | ICD-10-CM

## 2022-10-15 MED ORDER — HYDROCODONE-ACETAMINOPHEN 7.5-325 MG PO TABS: ORAL_TABLET | ORAL | 0 refills | Status: DC

## 2022-10-23 ENCOUNTER — Encounter (HOSPITAL_COMMUNITY): Payer: Self-pay | Admitting: Gastroenterology

## 2022-10-30 ENCOUNTER — Other Ambulatory Visit: Payer: Self-pay | Admitting: Family Medicine

## 2022-10-30 DIAGNOSIS — G8929 Other chronic pain: Secondary | ICD-10-CM

## 2022-10-31 ENCOUNTER — Encounter (HOSPITAL_COMMUNITY)
Admission: RE | Disposition: A | Discharge status: Home / Self Care | Payer: Self-pay | Source: Home / Self Care | Attending: Gastroenterology

## 2022-10-31 ENCOUNTER — Encounter (HOSPITAL_COMMUNITY): Payer: Self-pay | Admitting: Gastroenterology

## 2022-10-31 ENCOUNTER — Ambulatory Visit (HOSPITAL_COMMUNITY): Payer: PPO

## 2022-10-31 ENCOUNTER — Ambulatory Visit (HOSPITAL_BASED_OUTPATIENT_CLINIC_OR_DEPARTMENT_OTHER): Payer: PPO | Admitting: Anesthesiology

## 2022-10-31 ENCOUNTER — Ambulatory Visit (HOSPITAL_COMMUNITY): Payer: PPO | Admitting: Anesthesiology

## 2022-10-31 ENCOUNTER — Ambulatory Visit (HOSPITAL_COMMUNITY)
Admission: RE | Admit: 2022-10-31 | Discharge: 2022-10-31 | Disposition: A | Discharge status: Home / Self Care | Payer: PPO | Attending: Gastroenterology | Admitting: Gastroenterology

## 2022-10-31 DIAGNOSIS — K3189 Other diseases of stomach and duodenum: Secondary | ICD-10-CM | POA: Insufficient documentation

## 2022-10-31 DIAGNOSIS — K8051 Calculus of bile duct without cholangitis or cholecystitis with obstruction: Secondary | ICD-10-CM

## 2022-10-31 DIAGNOSIS — N1831 Chronic kidney disease, stage 3a: Secondary | ICD-10-CM

## 2022-10-31 DIAGNOSIS — F419 Anxiety disorder, unspecified: Secondary | ICD-10-CM | POA: Diagnosis not present

## 2022-10-31 DIAGNOSIS — K8050 Calculus of bile duct without cholangitis or cholecystitis without obstruction: Secondary | ICD-10-CM

## 2022-10-31 DIAGNOSIS — K219 Gastro-esophageal reflux disease without esophagitis: Secondary | ICD-10-CM | POA: Diagnosis not present

## 2022-10-31 DIAGNOSIS — I10 Essential (primary) hypertension: Secondary | ICD-10-CM | POA: Insufficient documentation

## 2022-10-31 DIAGNOSIS — R7989 Other specified abnormal findings of blood chemistry: Secondary | ICD-10-CM

## 2022-10-31 DIAGNOSIS — Z87891 Personal history of nicotine dependence: Secondary | ICD-10-CM

## 2022-10-31 DIAGNOSIS — R748 Abnormal levels of other serum enzymes: Secondary | ICD-10-CM | POA: Insufficient documentation

## 2022-10-31 DIAGNOSIS — I129 Hypertensive chronic kidney disease with stage 1 through stage 4 chronic kidney disease, or unspecified chronic kidney disease: Secondary | ICD-10-CM

## 2022-10-31 DIAGNOSIS — Z9049 Acquired absence of other specified parts of digestive tract: Secondary | ICD-10-CM | POA: Diagnosis not present

## 2022-10-31 DIAGNOSIS — K838 Other specified diseases of biliary tract: Secondary | ICD-10-CM

## 2022-10-31 DIAGNOSIS — K449 Diaphragmatic hernia without obstruction or gangrene: Secondary | ICD-10-CM | POA: Insufficient documentation

## 2022-10-31 DIAGNOSIS — K2950 Unspecified chronic gastritis without bleeding: Secondary | ICD-10-CM | POA: Diagnosis not present

## 2022-10-31 HISTORY — PX: SPYGLASS LITHOTRIPSY: SHX5537

## 2022-10-31 HISTORY — PX: BIOPSY: SHX5522

## 2022-10-31 HISTORY — PX: HEMOSTASIS CONTROL: SHX6838

## 2022-10-31 HISTORY — PX: SPHINCTEROTOMY: SHX5279

## 2022-10-31 HISTORY — PX: STONE EXTRACTION WITH BASKET: SHX5318

## 2022-10-31 HISTORY — PX: ENDOSCOPIC RETROGRADE CHOLANGIOPANCREATOGRAPHY (ERCP) WITH PROPOFOL: SHX5810

## 2022-10-31 HISTORY — PX: SPYGLASS CHOLANGIOSCOPY: SHX5441

## 2022-10-31 HISTORY — PX: REMOVAL OF STONES: SHX5545

## 2022-10-31 HISTORY — PX: BILIARY DILATION: SHX6850

## 2022-10-31 SURGERY — ENDOSCOPIC RETROGRADE CHOLANGIOPANCREATOGRAPHY (ERCP) WITH PROPOFOL
Anesthesia: General

## 2022-10-31 MED ORDER — SODIUM CHLORIDE 0.9 % IV SOLN: INTRAVENOUS | Status: DC

## 2022-10-31 MED ORDER — CIPROFLOXACIN IN D5W 400 MG/200ML IV SOLN
INTRAVENOUS | Status: DC | PRN
  Administered 2022-10-31: 400 mg via INTRAVENOUS

## 2022-10-31 MED ORDER — INDOMETHACIN 50 MG RE SUPP
RECTAL | Status: DC | PRN
  Administered 2022-10-31: 100 mg via RECTAL

## 2022-10-31 MED ORDER — PHENYLEPHRINE HCL (PRESSORS) 10 MG/ML IV SOLN
INTRAVENOUS | Status: DC | PRN
  Administered 2022-10-31: 160 ug via INTRAVENOUS

## 2022-10-31 MED ORDER — FENTANYL CITRATE (PF) 100 MCG/2ML IJ SOLN
INTRAMUSCULAR | Status: DC | PRN
  Administered 2022-10-31: 100 ug via INTRAVENOUS

## 2022-10-31 MED ORDER — LACTATED RINGERS IV SOLN: INTRAVENOUS | Status: DC

## 2022-10-31 MED ORDER — ONDANSETRON HCL 4 MG/2ML IJ SOLN
INTRAMUSCULAR | Status: DC | PRN
  Administered 2022-10-31: 4 mg via INTRAVENOUS

## 2022-10-31 MED ORDER — FENTANYL CITRATE (PF) 100 MCG/2ML IJ SOLN
INTRAMUSCULAR | Status: AC
  Filled 2022-10-31: qty 2

## 2022-10-31 MED ORDER — GLUCAGON HCL RDNA (DIAGNOSTIC) 1 MG IJ SOLR
INTRAMUSCULAR | Status: DC | PRN
  Administered 2022-10-31 (×2): .25 mg via INTRAVENOUS

## 2022-10-31 MED ORDER — INDOMETHACIN 50 MG RE SUPP
RECTAL | Status: AC
  Filled 2022-10-31: qty 1

## 2022-10-31 MED ORDER — GLUCAGON HCL RDNA (DIAGNOSTIC) 1 MG IJ SOLR
INTRAMUSCULAR | Status: AC
  Filled 2022-10-31: qty 1

## 2022-10-31 MED ORDER — SODIUM CHLORIDE 0.9 % IV SOLN
INTRAVENOUS | Status: DC | PRN
  Administered 2022-10-31: 100 mL

## 2022-10-31 MED ORDER — SUCCINYLCHOLINE CHLORIDE 200 MG/10ML IV SOSY
PREFILLED_SYRINGE | INTRAVENOUS | Status: DC | PRN
  Administered 2022-10-31: 100 mg via INTRAVENOUS

## 2022-10-31 MED ORDER — GLYCOPYRROLATE 0.2 MG/ML IJ SOLN
INTRAMUSCULAR | Status: DC | PRN
  Administered 2022-10-31: .2 mg via INTRAVENOUS

## 2022-10-31 MED ORDER — CIPROFLOXACIN HCL 500 MG PO TABS: 500.0000 mg | ORAL_TABLET | Freq: Two times a day (BID) | ORAL | 0 refills | Status: AC

## 2022-10-31 MED ORDER — CIPROFLOXACIN IN D5W 400 MG/200ML IV SOLN
INTRAVENOUS | Status: AC
  Filled 2022-10-31: qty 200

## 2022-10-31 MED ORDER — SUGAMMADEX SODIUM 200 MG/2ML IV SOLN
INTRAVENOUS | Status: DC | PRN
  Administered 2022-10-31: 200 mg via INTRAVENOUS

## 2022-10-31 MED ORDER — PROPOFOL 500 MG/50ML IV EMUL
INTRAVENOUS | Status: AC
  Filled 2022-10-31: qty 100

## 2022-10-31 MED ORDER — PROPOFOL 10 MG/ML IV BOLUS
INTRAVENOUS | Status: DC | PRN
  Administered 2022-10-31: 100 mg via INTRAVENOUS
  Administered 2022-10-31: 40 mg via INTRAVENOUS

## 2022-10-31 MED ORDER — LIDOCAINE HCL (CARDIAC) PF 100 MG/5ML IV SOSY
PREFILLED_SYRINGE | INTRAVENOUS | Status: DC | PRN
  Administered 2022-10-31: 60 mg via INTRAVENOUS

## 2022-10-31 MED ORDER — EPINEPHRINE 1 MG/10ML IJ SOSY
PREFILLED_SYRINGE | INTRAMUSCULAR | Status: AC
  Filled 2022-10-31: qty 10

## 2022-10-31 MED ORDER — ROCURONIUM BROMIDE 100 MG/10ML IV SOLN
INTRAVENOUS | Status: DC | PRN
  Administered 2022-10-31: 10 mg via INTRAVENOUS
  Administered 2022-10-31: 20 mg via INTRAVENOUS

## 2022-10-31 MED ORDER — PROPOFOL 1000 MG/100ML IV EMUL
INTRAVENOUS | Status: AC
  Filled 2022-10-31: qty 100

## 2022-10-31 NOTE — Discharge Instructions (Signed)
YOU HAD AN ENDOSCOPIC PROCEDURE TODAY: Refer to the procedure report and other information in the discharge instructions given to you for any specific questions about what was found during the examination. If this information does not answer your questions, please call [place] office at [phone] to clarify.   YOU SHOULD EXPECT: Some feelings of bloating in the abdomen. Passage of more gas than usual. Walking can help get rid of the air that was put into your GI tract during the procedure and reduce the bloating. If you had a lower endoscopy (such as a colonoscopy or flexible sigmoidoscopy) you may notice spotting of blood in your stool or on the toilet paper. Some abdominal soreness may be present for a day or two, also.  DIET: Your first meal following the procedure should be a light meal and then it is ok to progress to your normal diet. A half-sandwich or bowl of soup is an example of a good first meal. Heavy or fried foods are harder to digest and may make you feel nauseous or bloated. Drink plenty of fluids but you should avoid alcoholic beverages for 24 hours. If you had a esophageal dilation, please see attached instructions for diet.    ACTIVITY: Your care partner should take you home directly after the procedure. You should plan to take it easy, moving slowly for the rest of the day. You can resume normal activity the day after the procedure however YOU SHOULD NOT DRIVE, use power tools, machinery or perform tasks that involve climbing or major physical exertion for 24 hours (because of the sedation medicines used during the test).   SYMPTOMS TO REPORT IMMEDIATELY: A gastroenterologist can be reached at any hour. Please call [phone]  for any of the following symptoms:   Following upper endoscopy (EGD, EUS, ERCP, esophageal dilation) Vomiting of blood or coffee ground material  New, significant abdominal pain  New, significant chest pain or pain under the shoulder blades  Painful or  persistently difficult swallowing  New shortness of breath  Black, tarry-looking or red, bloody stools  FOLLOW UP:  If any biopsies were taken you will be contacted by phone or by letter within the next 1-3 weeks. Call [phone]  if you have not heard about the biopsies in 3 weeks.  Please also call with any specific questions about appointments or follow up tests.  
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
## 2022-10-31 NOTE — Op Note (Signed)
[facility] Patient Name: Barbara Miles Procedure Date: 10/31/2022 MRN: [id number] Attending MD: Justice Britain , MD, [id number] Date of Birth: 07/21/1951 CSN: [id number] Age: 71 Admit Type: Outpatient Procedure:                ERCP Indications:              Bile duct stone(s), Abnormal MRCP, Elevated liver                            enzymes Providers:                Justice Britain, MD, Doristine Johns, RN, Cletis Athens, Technician Referring MD:              Medicines:                General Anesthesia, Cipro 400 mg IV, Glucagon 0.75                            mg IV, Indomethacin 177 mg PR Complications:            No immediate complications. Estimated Blood Loss:     Estimated blood loss was minimal. Procedure:                Pre-Anesthesia Assessment:                           - Prior to the procedure, a History and Physical                            was performed, and patient medications and                            allergies were reviewed. The patient's tolerance of                            previous anesthesia was also reviewed. The risks                            and benefits of the procedure and the sedation                            options and risks were discussed with the patient.                            All questions were answered, and informed consent                            was obtained. Prior Anticoagulants: The patient has                            taken no anticoagulant or antiplatelet agents. ASA  Grade Assessment: III - A patient with severe                            systemic disease. After reviewing the risks and                            benefits, the patient was deemed in satisfactory                            condition to undergo the procedure.                           After obtaining informed consent, the scope was                            passed under direct vision.  Throughout the                            procedure, the patient's blood pressure, pulse, and                            oxygen saturations were monitored continuously. The                            [employer] D single use                            duodenoscope was introduced through the mouth, and                            used to inject contrast into and used to inject                            contrast into the bile duct. We had a technical                            issues that require scope exchange, so the                            [id number]-[id number] ([id number]) Olympus duodenoscope was                            introduced through the mouth, and used to inject                            contrast into and used for direct visualization of                            the bile duct. The ERCP was technically difficult                            and complex due to a large stone. Successful  completion of the procedure was aided by performing                            the maneuvers documented (below) in this report.                            The patient tolerated the procedure. Scope In: Scope Out: Findings:      A scout film of the abdomen was obtained. Surgical clips, consistent       with a previous cholecystectomy, were seen in the area of the right       upper quadrant of the abdomen.      The upper GI tract was traversed under direct vision without detailed       examination. A small hiatal hernia was present. Patchy moderately       erythematous mucosa without bleeding was found in the entire examined       stomach - biopsied for HP evaluation. No gross lesions were noted in the       duodenal bulb, in the first portion of the duodenum and in the second       portion of the duodenum. The major papilla was prominent and slightly       bulging.      A short 0.035 inch Soft Jagwire was passed into the biliary tree. The       Hydratome  sphincterotome was passed over the guidewire and the bile duct       was then deeply cannulated. Contrast was injected. I personally       interpreted the bile duct images. Ductal flow of contrast was adequate.       Image quality was adequate. Contrast extended to the hepatic ducts.       Opacification of the entire biliary tree except for the cystic duct and       gallbladder was successful. The lower third of the main bile duct       contained one stone, which was 20 mm in diameter. The main bile duct was       severely dilated, with aforementioned stone causing an obstruction. The       largest diameter was 15 mm. A 10 mm biliary sphincterotomy was made with       a monofilament Hydratome sphincterotome using ERBE electrocautery. There       was self limited oozing from the sphincterotomy which did not require       treatment. To aid in attempt at removal, DASE sphincteroplasty of the       common bile duct with a 12-13.5-15 mm balloon (to a maximum balloon size       of 15 mm) dilator was successful for a total of 3 minutes. Lithotripsy       with a basket-type device was not successful at capturing the large       stone. Decision made for the bile duct to be explored endoscopically       using the SpyGlass direct visualization system. The SpyScope was       advanced to the bifurcation. Visibility with the scope was fair. The       lower third of the main bile duct contained one stone. Electrohydraulic       lithotripsy was successful (>350 pulses). The biliary tree was then       swept with a  retrieval balloon. Sludge was swept from the duct. Many       stone fragments were removed. No stones remained. An occlusion       cholangiogram was performed that showed no further significant biliary       pathology.      A pancreatogram was not performed.      The duodenoscope was withdrawn from the patient. Impression:               - Small hiatal hernia.                           -  Erythematous mucosa in the stomach - biopsied for                            HP evaluation.                           - No gross lesions in the duodenal bulb, in the                            first portion of the duodenum and in the second                            portion of the duodenum.                           - The major papilla appeared to be bulging and                            prominent.                           - The entire main bile duct was severely dilated,                            with a stone causing an obstruction.                           - Choledocholithiasis was found. Complete removal                            was accomplished by biliary sphincterotomy/balloon                            sphincteroplasty/EH lithotripsy/balloon sweep. Moderate Sedation:      Not Applicable - Patient had care per Anesthesia. Recommendation:           - The patient will be observed post-procedure,                            until all discharge criteria are met.                           - Discharge patient to home.                           -  Patient has a contact number available for                            emergencies. The signs and symptoms of potential                            delayed complications were discussed with the                            patient. Return to normal activities tomorrow.                            Written discharge instructions were provided to the                            patient.                           - Low fat diet.                           - Observe patient's clinical course.                           - Check liver enzymes (AST, ALT, alkaline                            phosphatase, bilirubin) in 2 weeks.                           - Watch for pancreatitis, bleeding, perforation,                            and cholangitis.                           - Await path results.                           - The findings and recommendations were  discussed                            with the patient.                           - The findings and recommendations were discussed                            with the patient's family. Procedure Code(s):        --- Professional ---                           [phone], Endoscopic retrograde                            cholangiopancreatography (ERCP); with destruction  of calculi, any method (eg, mechanical,                            electrohydraulic, lithotripsy)                           [id number], Endoscopic cannulation of papilla with                            direct visualization of pancreatic/common bile                            duct(s) (List separately in addition to code(s) for                            primary procedure) Diagnosis Code(s):        --- Professional ---                           K44.9, Diaphragmatic hernia without obstruction or                            gangrene                           K31.89, Other diseases of stomach and duodenum                           K80.51, Calculus of bile duct without cholangitis                            or cholecystitis with obstruction                           R74.8, Abnormal levels of other serum enzymes                           K83.8, Other specified diseases of biliary tract                           R93.2, Abnormal findings on diagnostic imaging of                            liver and biliary tract CPT copyright 2022 American [employer] Association. All rights reserved. The codes documented in this report are preliminary and upon coder review may  be revised to meet current compliance requirements. Justice Britain, MD 10/31/2022 11:10:45 AM Number of Addenda: 0

## 2022-10-31 NOTE — Anesthesia Preprocedure Evaluation (Signed)
Anesthesia Evaluation  Patient identified by MRN, date of birth, ID band Patient awake    Reviewed: Allergy & Precautions, NPO status , Patient's Chart, lab work & pertinent test results  History of Anesthesia Complications Negative for: history of anesthetic complications  Airway Mallampati: I  TM Distance: >3 FB Neck ROM: Full    Dental  (+) Edentulous Upper, Edentulous Lower, Dental Advisory Given   Pulmonary neg shortness of breath, neg sleep apnea, neg COPD, neg recent URI, former smoker   breath sounds clear to auscultation       Cardiovascular hypertension, Pt. on medications (-) angina + Peripheral Vascular Disease   Rhythm:Regular     Neuro/Psych neg Seizures PSYCHIATRIC DISORDERS Anxiety        GI/Hepatic ,GERD  ,,Choledocholithiasis   Endo/Other    Renal/GU CRFRenal diseaseLab Results      Component                Value               Date                      CREATININE               1.18 (H)            07/31/2022                Musculoskeletal  (+)  Fibromyalgia -  Abdominal   Peds  Hematology negative hematology ROS (+)   Anesthesia Other Findings   Reproductive/Obstetrics                             Anesthesia Physical Anesthesia Plan  ASA: 3  Anesthesia Plan: General   Post-op Pain Management: Minimal or no pain anticipated   Induction: Intravenous  PONV Risk Score and Plan: 3 and Ondansetron and Dexamethasone  Airway Management Planned: Oral ETT  Additional Equipment: None  Intra-op Plan:   Post-operative Plan: Extubation in OR  Informed Consent: I have reviewed the patients History and Physical, chart, labs and discussed the procedure including the risks, benefits and alternatives for the proposed anesthesia with the patient or authorized representative who has indicated his/her understanding and acceptance.     Dental advisory given  Plan Discussed  with: CRNA  Anesthesia Plan Comments:        Anesthesia Quick Evaluation

## 2022-10-31 NOTE — Anesthesia Procedure Notes (Signed)
Procedure Name: Intubation Date/Time: 10/31/2022 9:06 AM  Performed by: Aline Brochure, CRNAPre-anesthesia Checklist: Patient identified, Patient being monitored, Timeout performed, Emergency Drugs available and Suction available Patient Re-evaluated:Patient Re-evaluated prior to induction Oxygen Delivery Method: Circle system utilized Preoxygenation: Pre-oxygenation with 100% oxygen Induction Type: IV induction Ventilation: Mask ventilation without difficulty Laryngoscope Size: Mac and 3 Grade View: Grade I Tube type: Oral Tube size: 7.0 mm Number of attempts: 1 Airway Equipment and Method: Stylet Placement Confirmation: ETT inserted through vocal cords under direct vision, positive ETCO2 and breath sounds checked- equal and bilateral Secured at: 21 cm Tube secured with: Tape Dental Injury: Teeth and Oropharynx as per pre-operative assessment

## 2022-10-31 NOTE — Transfer of Care (Signed)
Immediate Anesthesia Transfer of Care Note  Patient: Barbara Miles  Procedure(s) Performed: ENDOSCOPIC RETROGRADE CHOLANGIOPANCREATOGRAPHY (ERCP) WITH PROPOFOL SPHINCTEROTOMY STONE EXTRACTION WITH BASKET REMOVAL OF STONES HEMOSTASIS CONTROL BILIARY DILATION SPYGLASS CHOLANGIOSCOPY SPYGLASS LITHOTRIPSY BIOPSY  Patient Location: Endoscopy Unit  Anesthesia Type:General  Level of Consciousness: drowsy  Airway & Oxygen Therapy: Patient Spontanous Breathing and Patient connected to face mask oxygen  Post-op Assessment: Report given to RN and Post -op Vital signs reviewed and stable  Post vital signs: Reviewed and stable  Last Vitals:  Vitals Value Taken Time  BP 164/66 10/31/22 1100  Temp 35.9 C 10/31/22 1056  Pulse 83 10/31/22 1106  Resp 17 10/31/22 1106  SpO2 98 % 10/31/22 1106  Vitals shown include unvalidated device data.  Last Pain:  Vitals:   10/31/22 1056  TempSrc: Temporal  PainSc: Asleep         Complications: No notable events documented.

## 2022-11-01 NOTE — Anesthesia Postprocedure Evaluation (Signed)
Anesthesia Post Note  Patient: Barbara Miles  Procedure(s) Performed: ENDOSCOPIC RETROGRADE CHOLANGIOPANCREATOGRAPHY (ERCP) WITH PROPOFOL SPHINCTEROTOMY STONE EXTRACTION WITH BASKET REMOVAL OF STONES HEMOSTASIS CONTROL BILIARY DILATION SPYGLASS CHOLANGIOSCOPY SPYGLASS LITHOTRIPSY BIOPSY     Patient location during evaluation: Endoscopy Anesthesia Type: General Level of consciousness: awake and alert Pain management: pain level controlled Vital Signs Assessment: post-procedure vital signs reviewed and stable Respiratory status: spontaneous breathing, nonlabored ventilation, respiratory function stable and patient connected to nasal cannula oxygen Cardiovascular status: blood pressure returned to baseline and stable Postop Assessment: no apparent nausea or vomiting Anesthetic complications: no  No notable events documented.  Last Vitals:  Vitals:   10/31/22 1111 10/31/22 1119  BP: (!) 156/65 (!) 142/71  Pulse: 87 78  Resp: 14 19  Temp:    SpO2: 100% 99%    Last Pain:  Vitals:   10/31/22 1119  TempSrc:   PainSc: 0-No pain                 Eleanor Gatliff

## 2022-11-03 ENCOUNTER — Encounter (HOSPITAL_COMMUNITY): Payer: Self-pay | Admitting: Gastroenterology

## 2022-11-04 ENCOUNTER — Other Ambulatory Visit: Payer: Self-pay | Admitting: *Deleted

## 2022-11-04 ENCOUNTER — Encounter: Payer: Self-pay | Admitting: Gastroenterology

## 2022-11-04 ENCOUNTER — Telehealth: Payer: Self-pay | Admitting: Gastroenterology

## 2022-11-04 DIAGNOSIS — R7989 Other specified abnormal findings of blood chemistry: Secondary | ICD-10-CM

## 2022-11-04 LAB — SURGICAL PATHOLOGY

## 2022-11-04 NOTE — Telephone Encounter (Signed)
Patient underwent successful ERCP 10/31/2022.  She will need repeat HFP in 2-3 weeks, please let patient know we will be mailing her lab slips and she should plan to complete labs after the first of the year.   Venetia Night, MSN, APRN, FNP-BC, AGACNP-BC [facility] at [facility]

## 2022-11-04 NOTE — Telephone Encounter (Signed)
Spoke to pt, informed her of results and recommendations. Pt voiced understanding. Labs entered into Epic and mailed lab requisitions.

## 2022-11-13 ENCOUNTER — Other Ambulatory Visit: Payer: Self-pay

## 2022-11-13 DIAGNOSIS — G8929 Other chronic pain: Secondary | ICD-10-CM

## 2022-11-13 MED ORDER — HYDROCODONE-ACETAMINOPHEN 7.5-325 MG PO TABS: ORAL_TABLET | ORAL | 0 refills | Status: DC

## 2022-11-14 ENCOUNTER — Other Ambulatory Visit: Payer: Self-pay | Admitting: Family Medicine

## 2022-11-14 DIAGNOSIS — G8929 Other chronic pain: Secondary | ICD-10-CM

## 2022-11-19 ENCOUNTER — Other Ambulatory Visit: Payer: Self-pay

## 2022-11-19 ENCOUNTER — Other Ambulatory Visit (HOSPITAL_COMMUNITY)
Admission: RE | Admit: 2022-11-19 | Discharge: 2022-11-19 | Disposition: A | Discharge status: Home / Self Care | Payer: PPO | Source: Ambulatory Visit | Attending: *Deleted | Admitting: *Deleted

## 2022-11-19 ENCOUNTER — Encounter (HOSPITAL_COMMUNITY): Payer: Self-pay | Admitting: Emergency Medicine

## 2022-11-19 ENCOUNTER — Emergency Department (HOSPITAL_COMMUNITY): Payer: PPO

## 2022-11-19 ENCOUNTER — Emergency Department (HOSPITAL_COMMUNITY)
Admission: EM | Admit: 2022-11-19 | Discharge: 2022-11-19 | Disposition: A | Discharge status: Home / Self Care | Payer: PPO | Attending: Emergency Medicine | Admitting: Emergency Medicine

## 2022-11-19 DIAGNOSIS — Y9389 Activity, other specified: Secondary | ICD-10-CM | POA: Diagnosis not present

## 2022-11-19 DIAGNOSIS — Z7982 Long term (current) use of aspirin: Secondary | ICD-10-CM | POA: Diagnosis not present

## 2022-11-19 DIAGNOSIS — S0011XA Contusion of right eyelid and periocular area, initial encounter: Secondary | ICD-10-CM | POA: Insufficient documentation

## 2022-11-19 DIAGNOSIS — Z79899 Other long term (current) drug therapy: Secondary | ICD-10-CM | POA: Diagnosis not present

## 2022-11-19 DIAGNOSIS — S0990XA Unspecified injury of head, initial encounter: Secondary | ICD-10-CM

## 2022-11-19 DIAGNOSIS — S8001XA Contusion of right knee, initial encounter: Secondary | ICD-10-CM | POA: Insufficient documentation

## 2022-11-19 DIAGNOSIS — S0511XA Contusion of eyeball and orbital tissues, right eye, initial encounter: Secondary | ICD-10-CM | POA: Diagnosis not present

## 2022-11-19 DIAGNOSIS — W01198A Fall on same level from slipping, tripping and stumbling with subsequent striking against other object, initial encounter: Secondary | ICD-10-CM | POA: Diagnosis not present

## 2022-11-19 DIAGNOSIS — S0083XA Contusion of other part of head, initial encounter: Secondary | ICD-10-CM | POA: Diagnosis not present

## 2022-11-19 DIAGNOSIS — I10 Essential (primary) hypertension: Secondary | ICD-10-CM | POA: Insufficient documentation

## 2022-11-19 LAB — HEPATIC FUNCTION PANEL
ALT: 18 U/L (ref 0–44)
AST: 28 U/L (ref 15–41)
Albumin: 3.8 g/dL (ref 3.5–5.0)
Alkaline Phosphatase: 269 U/L — ABNORMAL HIGH (ref 38–126)
Bilirubin, Direct: 0.4 mg/dL — ABNORMAL HIGH (ref 0.0–0.2)
Indirect Bilirubin: 0.7 mg/dL (ref 0.3–0.9)
Total Bilirubin: 1.1 mg/dL (ref 0.3–1.2)
Total Protein: 7.6 g/dL (ref 6.5–8.1)

## 2022-11-19 NOTE — Discharge Instructions (Signed)
The CT scan of your face and head today were reassuring.  No indications of any broken bones or bleeding of your brain.  You may have a mild concussion which can cause some nausea and headaches.  I recommend that you take Tylenol every 4 hours if needed.  You may apply cool compresses on and off to your face to help with swelling.  Please follow-up with your primary care provider for recheck in a few days.  Return to the emergency department for any new or worsening symptoms.

## 2022-11-19 NOTE — ED Triage Notes (Signed)
Pt reports tripping and falling yesterday, reports hitting the right side of her face on a metal heat vent, bruising further noted to right knee, swelling and bruising to right temple/eye

## 2022-11-19 NOTE — ED Provider Notes (Signed)
[facility] EMERGENCY DEPARTMENT Provider Note   CSN: [id number] Arrival date & time: 11/19/22  1151     History {Add pertinent medical, surgical, social history, OB history to HPI:1} Chief Complaint  Patient presents with   Barbara Miles is a 72 y.o. female.   Fall Pertinent negatives include no chest pain, no abdominal pain and no shortness of breath.       Barbara Miles is a 72 y.o. female past medical history of hypertension, hyperlipidemia, who presents to the Emergency Department requesting evaluation of injury sustained in a mechanical fall that occurred yesterday.  States that she tripped while carrying groceries.  States that she tripped over the ledge at the door falling face forward.  States that her right eye struck a metal heat vent in the floor.  She also fell on her right knee as well.  She woke this morning with bruising tenderness along her right periorbital area.  Pain radiates to her temple.  She has some mild tenderness to her forehead as well.  States that she had difficulty standing initially, but denies any loss of consciousness or dizziness.  She believes her difficulty with standing was related to her knee injury.  Since the fall, she denies having any chest pain, shortness of breath, abdominal pain numbness or weakness, visual changes or swelling of her face.  She does not take blood thinners. Denies neck pain    Home Medications Prior to Admission medications   Medication Sig Start Date End Date Taking? Authorizing Provider  aspirin 81 MG chewable tablet Chew 81 mg by mouth at bedtime.    [provider]  Calcium Carbonate-Vitamin D (CALCIUM-VITAMIN D) 500-200 MG-UNIT per tablet Take 1 tablet by mouth 2 (two) times daily.      [provider]  cyclobenzaprine (FLEXERIL) 10 MG tablet TAKE ONE TABLET BY MOUTH DAILY AT BEDTIME. 12/04/21   Hensel, Jamal Collin, MD  gabapentin (NEURONTIN) 100 MG capsule TAKE 1 CAPSULE BY MOUTH THREE TIMES A  DAY. 10/30/22   McDiarmid, Blane Ohara, MD  HYDROcodone-acetaminophen (NORCO) 7.5-325 MG tablet TAKE (1) TABLET BY MOUTH EVERY EIGHT HOURS AS NEEDED. 11/15/22   Zenia Resides, MD  lisinopril (ZESTRIL) 20 MG tablet TAKE 1 TABLET BY MOUTH ONCE A DAY. 12/04/21   Zenia Resides, MD  rosuvastatin (CRESTOR) 10 MG tablet Take 1 tablet (10 mg total) by mouth every other day. 06/24/22   Zenia Resides, MD  sertraline (ZOLOFT) 100 MG tablet TAKE 1 TABLET EVERY DAY. 12/04/21   Zenia Resides, MD  triamcinolone cream (KENALOG) 0.5 % APPLY TO AFFECTED AREA(S) DAILY AS NEEDED. 06/24/22   Zenia Resides, MD      Allergies    Codeine phosphate and Nickel    Review of Systems   Review of Systems  Constitutional:  Negative for appetite change and fever.  HENT:  Negative for congestion, dental problem, ear pain, facial swelling, hearing loss and trouble swallowing.        Bruising tenderness right periorbital area, tenderness of the mid forehead  Eyes:  Negative for pain and visual disturbance.  Respiratory:  Negative for chest tightness and shortness of breath.   Cardiovascular:  Negative for chest pain.  Gastrointestinal:  Negative for abdominal pain, diarrhea and vomiting.  Genitourinary:  Negative for dysuria and flank pain.  Musculoskeletal:  Negative for back pain and neck pain.  Skin:  Negative for wound.  Neurological:  Negative for dizziness, seizures,  syncope, speech difficulty, weakness and numbness.  Psychiatric/Behavioral:  Negative for confusion.     Physical Exam Updated Vital Signs BP 121/81 (BP Location: Right Arm)   Pulse 69   Temp 98.5 F (36.9 C) (Oral)   Resp 18   Ht '5\' 6"'$  (1.676 m)   Wt 77.6 kg   SpO2 96%   BMI 27.60 kg/m  Physical Exam Vitals and nursing note reviewed.  Constitutional:      General: She is not in acute distress.    Appearance: Normal appearance. She is not toxic-appearing.  HENT:     Head:     Comments: Ecchymosis of right periorbital region.   No open wounds or edema.  Nose is non tender, no epistaxis.     Nose: Nose normal.     Mouth/Throat:     Mouth: Mucous membranes are moist.  Eyes:     Extraocular Movements: Extraocular movements intact.     Conjunctiva/sclera: Conjunctivae normal.     Pupils: Pupils are equal, round, and reactive to light.  Cardiovascular:     Rate and Rhythm: Normal rate and regular rhythm.     Pulses: Normal pulses.  Pulmonary:     Effort: Pulmonary effort is normal. No respiratory distress.  Chest:     Chest wall: No tenderness.  Abdominal:     General: There is no distension.     Palpations: Abdomen is soft.     Tenderness: There is no abdominal tenderness.  Musculoskeletal:        General: Tenderness and signs of injury present. Normal range of motion.     Cervical back: Normal range of motion. No rigidity or tenderness.     Right knee: Ecchymosis present. No swelling, effusion, bony tenderness or crepitus. Normal range of motion. No tenderness. Normal alignment.     Comments: Mild bruising along the anterior right knee.  Do not appreciate any bony deformities or obvious ligamentous instability, no pain on valgus or varus stress.  No palpable effusion or edema.  Patient able to perform full range of motion of the knee without difficulty.  She is weightbearing.  Skin:    General: Skin is warm.     Capillary Refill: Capillary refill takes less than 2 seconds.  Neurological:     General: No focal deficit present.     Mental Status: She is alert.     Sensory: No sensory deficit.     Motor: No weakness.     ED Results / Procedures / Treatments   Labs (all labs ordered are listed, but only abnormal results are displayed) Labs Reviewed - No data to display  EKG None  Radiology No results found.  Procedures Procedures  {Document cardiac monitor, telemetry assessment procedure when appropriate:1}  Medications Ordered in ED Medications - No data to display  ED Course/ Medical Decision  Making/ A&P                           Medical Decision Making Amount and/or Complexity of Data Reviewed Radiology: ordered.     {Document critical care time when appropriate:1} {Document review of labs and clinical decision tools ie heart score, Chads2Vasc2 etc:1}  {Document your independent review of radiology images, and any outside records:1} {Document your discussion with family members, caretakers, and with consultants:1} {Document social determinants of health affecting pt's care:1} {Document your decision making why or why not admission, treatments were needed:1} Final Clinical Impression(s) / ED Diagnoses Final  diagnoses:  None    Rx / DC Orders ED Discharge Orders     None

## 2022-11-20 ENCOUNTER — Other Ambulatory Visit: Payer: Self-pay | Admitting: *Deleted

## 2022-11-20 DIAGNOSIS — R748 Abnormal levels of other serum enzymes: Secondary | ICD-10-CM

## 2022-11-21 ENCOUNTER — Telehealth: Payer: Self-pay

## 2022-11-21 NOTE — Telephone Encounter (Signed)
        Patient  visited [place] on 1/2    Telephone encounter attempt :  1st  A HIPAA compliant voice message was left requesting a return call.  Instructed patient to call back .    [place], Care Management  [phone] [address], [place], [place] [address] Phone: [phone] Email: [email or web address]

## 2022-11-22 ENCOUNTER — Telehealth: Payer: Self-pay

## 2022-11-22 NOTE — Telephone Encounter (Signed)
        Patient  visited [place] on 1/2   Telephone encounter attempt : 2nd   A HIPAA compliant voice message was left requesting a return call.  Instructed patient to call back .    [place], Care Management  [phone] [address], [place], [place] [address] Phone: [phone] Email: [email or web address]

## 2022-11-28 ENCOUNTER — Other Ambulatory Visit: Payer: Self-pay | Admitting: Family Medicine

## 2022-11-28 DIAGNOSIS — G8929 Other chronic pain: Secondary | ICD-10-CM

## 2022-12-12 ENCOUNTER — Other Ambulatory Visit: Payer: Self-pay

## 2022-12-12 DIAGNOSIS — G8929 Other chronic pain: Secondary | ICD-10-CM

## 2022-12-12 MED ORDER — HYDROCODONE-ACETAMINOPHEN 7.5-325 MG PO TABS: ORAL_TABLET | ORAL | 0 refills | Status: DC

## 2022-12-23 ENCOUNTER — Other Ambulatory Visit (HOSPITAL_COMMUNITY)
Admission: RE | Admit: 2022-12-23 | Discharge: 2022-12-23 | Disposition: A | Discharge status: Home / Self Care | Payer: PPO | Source: Ambulatory Visit | Attending: Gastroenterology | Admitting: Gastroenterology

## 2022-12-23 DIAGNOSIS — R748 Abnormal levels of other serum enzymes: Secondary | ICD-10-CM | POA: Insufficient documentation

## 2022-12-23 LAB — ALKALINE PHOSPHATASE: Alkaline Phosphatase: 129 U/L — ABNORMAL HIGH (ref 38–126)

## 2022-12-24 ENCOUNTER — Encounter: Payer: Self-pay | Admitting: Family Medicine

## 2022-12-24 ENCOUNTER — Other Ambulatory Visit: Payer: Self-pay | Admitting: *Deleted

## 2022-12-24 DIAGNOSIS — R748 Abnormal levels of other serum enzymes: Secondary | ICD-10-CM

## 2022-12-27 ENCOUNTER — Other Ambulatory Visit: Payer: Self-pay | Admitting: Family Medicine

## 2022-12-27 DIAGNOSIS — I10 Essential (primary) hypertension: Secondary | ICD-10-CM

## 2022-12-27 DIAGNOSIS — E7800 Pure hypercholesterolemia, unspecified: Secondary | ICD-10-CM

## 2022-12-27 DIAGNOSIS — R4589 Other symptoms and signs involving emotional state: Secondary | ICD-10-CM

## 2022-12-27 DIAGNOSIS — I739 Peripheral vascular disease, unspecified: Secondary | ICD-10-CM

## 2023-01-06 ENCOUNTER — Telehealth: Payer: Self-pay | Admitting: Family Medicine

## 2023-01-06 NOTE — Telephone Encounter (Signed)
Contacted Barbara Miles to schedule their annual wellness visit. Appointment made for 01/17/2023.  Thank you,  [place] Direct dial  [phone]

## 2023-01-13 ENCOUNTER — Other Ambulatory Visit: Payer: Self-pay

## 2023-01-13 DIAGNOSIS — G8929 Other chronic pain: Secondary | ICD-10-CM

## 2023-01-13 MED ORDER — HYDROCODONE-ACETAMINOPHEN 7.5-325 MG PO TABS: ORAL_TABLET | ORAL | 0 refills | Status: DC

## 2023-01-13 NOTE — Telephone Encounter (Signed)
Patient needs to be seen by her new prescribing physician
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
## 2023-01-16 NOTE — Patient Instructions (Signed)

## 2023-01-17 ENCOUNTER — Ambulatory Visit (INDEPENDENT_AMBULATORY_CARE_PROVIDER_SITE_OTHER): Payer: PPO

## 2023-01-17 DIAGNOSIS — Z0000 Encounter for general adult medical examination without abnormal findings: Secondary | ICD-10-CM | POA: Diagnosis not present

## 2023-02-11 ENCOUNTER — Other Ambulatory Visit: Payer: Self-pay

## 2023-02-11 DIAGNOSIS — G8929 Other chronic pain: Secondary | ICD-10-CM

## 2023-02-12 ENCOUNTER — Encounter: Payer: Self-pay | Admitting: Family Medicine

## 2023-02-12 MED ORDER — HYDROCODONE-ACETAMINOPHEN 7.5-325 MG PO TABS: ORAL_TABLET | ORAL | 0 refills | Status: DC

## 2023-02-12 NOTE — Telephone Encounter (Signed)
Patient needs appointment with [facility] physician before further refills.  No further refills will be provided without this visit within the month

## 2023-02-12 NOTE — Progress Notes (Unsigned)
I have asked [facility] nursing to schedule an appointment for Barbara Miles to be seen by me or another physician on Dr Lowella Bandy [place] to discuss her pain and Norco therapy within a month..  Dr Yocheved Depner will be unable to refill the Norco in April unless she has a visit about the pain and its treatment.    If patient seen by resident for above chronic condition, let Dr Anniece Bleiler know.  He will refill patient's Norco.

## 2023-03-11 ENCOUNTER — Telehealth: Payer: Self-pay | Admitting: *Deleted

## 2023-03-11 ENCOUNTER — Other Ambulatory Visit: Payer: Self-pay | Admitting: *Deleted

## 2023-03-11 DIAGNOSIS — R748 Abnormal levels of other serum enzymes: Secondary | ICD-10-CM

## 2023-03-11 NOTE — Telephone Encounter (Signed)
Mailed lab requisitions to have completed in May.  

## 2023-03-13 ENCOUNTER — Other Ambulatory Visit: Payer: Self-pay

## 2023-03-13 DIAGNOSIS — G8929 Other chronic pain: Secondary | ICD-10-CM

## 2023-03-13 MED ORDER — HYDROCODONE-ACETAMINOPHEN 7.5-325 MG PO TABS: ORAL_TABLET | ORAL | 0 refills | Status: DC

## 2023-03-13 NOTE — Telephone Encounter (Signed)
Patient calls nurse line regarding prescription refill.   She has been scheduled for next available with Dr. Perley Jain on 5/2.  Please advise if patient can receive partial refill to last until this appointment. Thanks.   Veronda Prude, RN

## 2023-03-20 ENCOUNTER — Ambulatory Visit (INDEPENDENT_AMBULATORY_CARE_PROVIDER_SITE_OTHER): Payer: PPO | Admitting: Family Medicine

## 2023-03-20 ENCOUNTER — Encounter: Payer: Self-pay | Admitting: Family Medicine

## 2023-03-20 VITALS — BP 117/50 | HR 67 | Ht 66.0 in | Wt 173.2 lb

## 2023-03-20 DIAGNOSIS — R748 Abnormal levels of other serum enzymes: Secondary | ICD-10-CM

## 2023-03-20 DIAGNOSIS — M797 Fibromyalgia: Secondary | ICD-10-CM

## 2023-03-20 DIAGNOSIS — F411 Generalized anxiety disorder: Secondary | ICD-10-CM | POA: Diagnosis not present

## 2023-03-20 DIAGNOSIS — I10 Essential (primary) hypertension: Secondary | ICD-10-CM | POA: Diagnosis not present

## 2023-03-20 DIAGNOSIS — G8929 Other chronic pain: Secondary | ICD-10-CM | POA: Diagnosis not present

## 2023-03-20 DIAGNOSIS — N1831 Chronic kidney disease, stage 3a: Secondary | ICD-10-CM

## 2023-03-20 DIAGNOSIS — E7800 Pure hypercholesterolemia, unspecified: Secondary | ICD-10-CM

## 2023-03-20 NOTE — Patient Instructions (Addendum)
Your blood pressure is under good control.  Keep taking your blood pressure medications.   We are checking your cholesterol and a blood test your GI doctor wanted, called alkaline phosphatase.     Tetanus Boost  It is time for your Tetanus booster.  The vaccination also contains a booster to prevent aults from giving whooping cough (pertussis) to babies.  If you think you will be around babies less than 6 months old in the next 10 years, I would recommend you getting the vaccination.  The vaccination is covered 100% by Medicare.    SHINGLES VACCINATION  Chickenpox and shingles are related because they are caused by the same virus (varicella-zoster virus). After a person recovers from chickenpox, the virus stays dormant (inactive) in the body. It can reactivate years later and cause shingles.  CDC recommends that adults 50 years and older get two doses of the shingles vaccine called Shingrix (recombinant zoster vaccine) to prevent shingles and the complications from the disease.  Shingrix vaccination provides strong protection against shingles. In adults 50 years Shingrix is more than 90% effective at preventing shingles.  Studies show that Shingrix is safe. The vaccine helps your body create a strong defense against shingles. As a result, you are likely to have temporary side effects from getting the shots. Some people feel they are having a mild flu without the head cold symptoms.  The side effects might affect your ability to do normal daily activities for 2 to 3 days.  Getting the shot when you have a couple days without commitments is a good idea, in case you do feel under the weather.  Medicare prescription drug plans (Part D) usually cover all available vaccines needed to prevent illness, like the shingles (Shingrix) shot. Contact your Medicare drug plan If you are uncertain if they will cover the shingles vaccination.   Your pharmacist can give you Shingrix as a shot in your upper arm.  You will need two Shingrix vaccinations to complete your inoculation against shingles.  The second injection is given at least 2 months after the first Shingrix vaccination was given.

## 2023-03-21 ENCOUNTER — Encounter: Payer: Self-pay | Admitting: Family Medicine

## 2023-03-21 LAB — LIPID PANEL
Chol/HDL Ratio: 2.8 ratio (ref 0.0–4.4)
Cholesterol, Total: 136 mg/dL (ref 100–199)
HDL: 49 mg/dL (ref >39)
LDL Chol Calc (NIH): 66 mg/dL (ref 0–99)
Triglycerides: 120 mg/dL (ref 0–149)
VLDL Cholesterol Cal: 21 mg/dL (ref 5–40)

## 2023-03-21 LAB — ALKALINE PHOSPHATASE: Alkaline Phosphatase: 110 IU/L (ref 44–121)

## 2023-03-21 LAB — MICROALBUMIN / CREATININE URINE RATIO
Creatinine, Urine: 227.4 mg/dL
Microalb/Creat Ratio: 13 mg/g creat (ref 0–29)
Microalbumin, Urine: 30.1 ug/mL

## 2023-03-21 NOTE — Progress Notes (Signed)
Barbara Miles is accompanied by husband Sources of clinical information for visit is/are patient and spouse/SO. Nursing assessment for this office visit was reviewed with the patient for accuracy and revision.     Previous Report(s) Reviewed: Request for Labs by Ms Cori Razor of [facility] and ED visit after fall and head contusion    03/20/2023    1:42 PM  Depression screen PHQ 2/9  Decreased Interest 3  Down, Depressed, Hopeless 3  PHQ - 2 Score 6  Altered sleeping 3  Tired, decreased energy 3  Change in appetite 3  Feeling bad or failure about yourself  0  Trouble concentrating 3  Moving slowly or fidgety/restless 0  Suicidal thoughts 0  PHQ-9 Score 18  Difficult doing work/chores Very difficult   Flowsheet Row Office Visit from 03/20/2023 in [facility] Office Visit from 07/31/2022 in [facility] Office Visit from 06/24/2022 in [facility]  Thoughts that you would be better off dead, or of hurting yourself in some way Not at all Not at all Not at all  PHQ-9 Total Score 18 5 19           03/20/2023    1:42 PM 01/17/2023    9:54 AM 06/24/2022   10:52 AM 06/13/2020   12:38 PM 01/06/2019    3:07 PM  Fall Risk   Falls in the past year? 1 1 0 0 0  Comment    Emmi Telephone Survey: data to providers prior to load   Number falls in past yr: 0 0 0    Injury with Fall? 1 1     Risk for fall due to :  Impaired balance/gait     Follow up  Falls evaluation completed Falls evaluation completed         03/20/2023    1:42 PM 01/17/2023    9:51 AM 07/31/2022   10:49 AM  PHQ9 SCORE ONLY  PHQ-9 Total Score 18 1 5     There are no preventive care reminders to display for this patient.  Health Maintenance Due  Topic Date Due   Zoster Vaccines- Shingrix (1 of 2) Never done   DTaP/Tdap/Td (3 - Td or Tdap) 12/26/2021   COVID-19 Vaccine (3 - 2023-24 season) 07/19/2022      History/P.E. limitations: none  There are  no preventive care reminders to display for this patient. There are no preventive care reminders to display for this patient.  Health Maintenance Due  Topic Date Due   Zoster Vaccines- Shingrix (1 of 2) Never done   DTaP/Tdap/Td (3 - Td or Tdap) 12/26/2021   COVID-19 Vaccine (3 - 2023-24 season) 07/19/2022     Chief Complaint  Patient presents with   Hypertension     --------------------------------------------------------------------------------------------------------------------------------------------- Visit Problem List with A/P  Encounter for chronic pain management Established problem Controlled and allowing pt ability to complete ADLs and iADLs in efficient fashion.  No significant ADE identifies. No aberrant behaviors demostrated Continue opioid analgesic current therapy regiment. Patient asking if increase in her gabapentin may help her anxiety.   Fibromyalgia Established problem Well Controlled. Patient is at goal of ability to perform ADLs and iADLs. No signs of complications, medication side effects, or red flags. Gabapentin appears to be helping Continue current medications and other regiments.  I discussed with Ms Delafuente the risks with Flexeril of cognitive impairment and falls.   She seemed open to the idea of titrating off the Flexeril, especially  if the gabapentin dose were increased.    Generalized anxiety disorder Established problem. Stable. Patient was able to drive from [place] to [facility].  She reports it has been a longtime since she had driven here.  No signs of complications, medication side effects, or red flags. Continue sertrraline and gabapentin.  Encouraged Ms Krigbaum to take the current gabapentin 100 mg three times a day as prescribed rather than the twice a day which is what she thought she was suppose to do.  She is interested in increasing the gabapentin dose to see if it can further help her anxiety without significant adverse  effects.   HYPERCHOLESTEROLEMIA Established problem. Stable. Ms Schilke is tolerating her atorvastatin Lipids are pending Continue current medications and other regiments.   HYPERTENSION, BENIGN SYSTEMIC Established problem. Adequate blood pressure control.  No evidence of new end organ damage.  Tolerating medication without significant adverse effects.  Plan to continue current blood pressure medication regiment.

## 2023-03-21 NOTE — Assessment & Plan Note (Signed)
Established problem. Stable. Patient was able to drive from [place] to [facility]  She reports it has been a longtime since she had driven here.  No signs of complications, medication side effects, or red flags. Continue sertrraline and gabapentin.  Encouraged Barbara Miles to take the current gabapentin 100 mg three times a day as prescribed rather than the twice a day which is what she thought she was suppose to do.  She is interested in increasing the gabapentin dose to see if it can further help her anxiety without significant adverse effects.

## 2023-03-21 NOTE — Assessment & Plan Note (Signed)
Established problem Controlled and allowing pt ability to complete ADLs and iADLs in efficient fashion.  No significant ADE identifies. No aberrant behaviors demostrated Continue opioid analgesic current therapy regiment. Patient asking if increase in her gabapentin may help her anxiety.

## 2023-03-21 NOTE — Assessment & Plan Note (Addendum)
Established problem Well Controlled. Patient is at goal of ability to perform ADLs and iADLs. No signs of complications, medication side effects, or red flags. Gabapentin appears to be helping Continue current medications and other regiments.  I discussed with Barbara Miles the risks with Flexeril of cognitive impairment and falls.   She seemed open to the idea of titrating off the Flexeril, especially if the gabapentin dose were increased.

## 2023-03-21 NOTE — Assessment & Plan Note (Signed)
Established problem. Adequate blood pressure control.  No evidence of new end organ damage.  Tolerating medication without significant adverse effects.  Plan to continue current blood pressure medication regiment.   

## 2023-03-21 NOTE — Assessment & Plan Note (Signed)
Established problem. Stable. Barbara Miles is tolerating her atorvastatin Lipids are pending Continue current medications and other regiments.

## 2023-04-16 ENCOUNTER — Other Ambulatory Visit: Payer: Self-pay

## 2023-04-16 DIAGNOSIS — G8929 Other chronic pain: Secondary | ICD-10-CM

## 2023-04-17 ENCOUNTER — Other Ambulatory Visit: Payer: Self-pay | Admitting: Family Medicine

## 2023-04-17 DIAGNOSIS — G8929 Other chronic pain: Secondary | ICD-10-CM

## 2023-04-17 NOTE — Telephone Encounter (Signed)
Patient calls nurse line checking the status of medication refill.   She reports this needs to be filled by the end of the day today in order for medication delivery tomorrow.  Will forward to PCP.

## 2023-05-01 ENCOUNTER — Other Ambulatory Visit: Payer: Self-pay

## 2023-05-02 MED ORDER — CYCLOBENZAPRINE HCL 10 MG PO TABS: 10.0000 mg | ORAL_TABLET | Freq: Every day | ORAL | 0 refills | Status: DC

## 2023-05-16 ENCOUNTER — Other Ambulatory Visit: Payer: Self-pay

## 2023-05-16 DIAGNOSIS — G8929 Other chronic pain: Secondary | ICD-10-CM

## 2023-05-16 MED ORDER — HYDROCODONE-ACETAMINOPHEN 7.5-325 MG PO TABS
1.0000 | ORAL_TABLET | Freq: Three times a day (TID) | ORAL | 0 refills | Status: DC | PRN
Start: 2023-05-16 — End: 2023-06-12

## 2023-06-12 ENCOUNTER — Other Ambulatory Visit: Payer: Self-pay

## 2023-06-12 DIAGNOSIS — G8929 Other chronic pain: Secondary | ICD-10-CM

## 2023-06-16 MED ORDER — HYDROCODONE-ACETAMINOPHEN 7.5-325 MG PO TABS
1.0000 | ORAL_TABLET | Freq: Three times a day (TID) | ORAL | 0 refills | Status: DC | PRN
Start: 2023-06-16 — End: 2023-07-15

## 2023-07-15 ENCOUNTER — Other Ambulatory Visit: Payer: Self-pay

## 2023-07-15 DIAGNOSIS — G8929 Other chronic pain: Secondary | ICD-10-CM

## 2023-07-15 MED ORDER — HYDROCODONE-ACETAMINOPHEN 7.5-325 MG PO TABS
1.0000 | ORAL_TABLET | Freq: Three times a day (TID) | ORAL | 0 refills | Status: DC | PRN
Start: 2023-07-15 — End: 2023-08-14

## 2023-07-25 ENCOUNTER — Other Ambulatory Visit: Payer: Self-pay | Admitting: Family Medicine

## 2023-08-11 ENCOUNTER — Ambulatory Visit (INDEPENDENT_AMBULATORY_CARE_PROVIDER_SITE_OTHER): Payer: PPO | Admitting: Family Medicine

## 2023-08-11 ENCOUNTER — Encounter: Payer: Self-pay | Admitting: Family Medicine

## 2023-08-11 VITALS — BP 98/79 | HR 73 | Ht 66.0 in | Wt 172.4 lb

## 2023-08-11 DIAGNOSIS — E7800 Pure hypercholesterolemia, unspecified: Secondary | ICD-10-CM

## 2023-08-11 DIAGNOSIS — G8929 Other chronic pain: Secondary | ICD-10-CM | POA: Diagnosis not present

## 2023-08-11 DIAGNOSIS — Z23 Encounter for immunization: Secondary | ICD-10-CM | POA: Diagnosis not present

## 2023-08-11 DIAGNOSIS — I739 Peripheral vascular disease, unspecified: Secondary | ICD-10-CM | POA: Diagnosis not present

## 2023-08-11 DIAGNOSIS — I10 Essential (primary) hypertension: Secondary | ICD-10-CM | POA: Diagnosis not present

## 2023-08-11 DIAGNOSIS — F411 Generalized anxiety disorder: Secondary | ICD-10-CM

## 2023-08-11 DIAGNOSIS — R4589 Other symptoms and signs involving emotional state: Secondary | ICD-10-CM | POA: Diagnosis not present

## 2023-08-11 MED ORDER — LISINOPRIL 20 MG PO TABS
10.0000 mg | ORAL_TABLET | Freq: Every day | ORAL | 0 refills | Status: DC
Start: 2023-08-11 — End: 2023-12-25

## 2023-08-11 MED ORDER — TRIAMCINOLONE ACETONIDE 0.5 % EX CREA: TOPICAL_CREAM | CUTANEOUS | 2 refills | Status: DC

## 2023-08-11 MED ORDER — SERTRALINE HCL 100 MG PO TABS: 150.0000 mg | ORAL_TABLET | Freq: Every day | ORAL | 1 refills | Status: DC

## 2023-08-11 NOTE — Patient Instructions (Addendum)
It was great to see you!  Our plans for today:  - Keep an eye on your blood pressure. We are reducing your dose of your blood pressure medicine.  - We are increasing your sertraline.  - Get your shingles vaccine and your tetanus vaccines from the pharmacy.  - Follow up in 6-8 weeks for virtual visit.   We are checking some labs today, we will release these results to your MyChart.  Take care and seek immediate care sooner if you develop any concerns.   Dr. Linwood Dibbles
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
## 2023-08-12 LAB — BASIC METABOLIC PANEL
BUN/Creatinine Ratio: 26 (ref 12–28)
BUN: 29 mg/dL — ABNORMAL HIGH (ref 8–27)
CO2: 27 mmol/L (ref 20–29)
Calcium: 9.9 mg/dL (ref 8.7–10.3)
Chloride: 98 mmol/L (ref 96–106)
Creatinine, Ser: 1.1 mg/dL — ABNORMAL HIGH (ref 0.57–1.00)
Glucose: 88 mg/dL (ref 70–99)
Potassium: 4.8 mmol/L (ref 3.5–5.2)
Sodium: 141 mmol/L (ref 134–144)
eGFR: 54 mL/min/{1.73_m2} — ABNORMAL LOW (ref >59)
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
## 2023-08-12 NOTE — Assessment & Plan Note (Signed)
Uncontrolled. Increase sertraline to 150mg . F/u 1 month, consider adding buspar at next visit if needed.

## 2023-08-12 NOTE — Assessment & Plan Note (Signed)
Below goal with orthostatic sx. Reduce lisinopril, monitor at home. F/u 1 month, ok to do virtual if keeps BP log at home, otherwise needs to be in person.

## 2023-08-12 NOTE — Assessment & Plan Note (Signed)
Tolerant of statin, continue.

## 2023-08-12 NOTE — Assessment & Plan Note (Signed)
Continue statin, ASA. Unfortunately on norco TID but able to keep her walking. Wean as able.

## 2023-08-14 ENCOUNTER — Other Ambulatory Visit: Payer: Self-pay

## 2023-08-14 DIAGNOSIS — G8929 Other chronic pain: Secondary | ICD-10-CM

## 2023-08-14 MED ORDER — HYDROCODONE-ACETAMINOPHEN 7.5-325 MG PO TABS: 1.0000 | ORAL_TABLET | Freq: Three times a day (TID) | ORAL | 0 refills | Status: DC | PRN

## 2023-09-15 ENCOUNTER — Other Ambulatory Visit: Payer: Self-pay

## 2023-09-15 DIAGNOSIS — G8929 Other chronic pain: Secondary | ICD-10-CM

## 2023-09-16 MED ORDER — HYDROCODONE-ACETAMINOPHEN 7.5-325 MG PO TABS: 1.0000 | ORAL_TABLET | Freq: Three times a day (TID) | ORAL | 0 refills | Status: DC | PRN

## 2023-10-13 ENCOUNTER — Other Ambulatory Visit: Payer: Self-pay

## 2023-10-13 DIAGNOSIS — G8929 Other chronic pain: Secondary | ICD-10-CM

## 2023-10-14 ENCOUNTER — Other Ambulatory Visit: Payer: Self-pay | Admitting: Family Medicine

## 2023-10-14 MED ORDER — HYDROCODONE-ACETAMINOPHEN 7.5-325 MG PO TABS: 1.0000 | ORAL_TABLET | Freq: Three times a day (TID) | ORAL | 0 refills | Status: DC | PRN

## 2023-11-15 ENCOUNTER — Other Ambulatory Visit: Payer: Self-pay | Admitting: Family Medicine

## 2023-11-15 DIAGNOSIS — G8929 Other chronic pain: Secondary | ICD-10-CM

## 2023-11-19 ENCOUNTER — Encounter: Payer: Self-pay | Admitting: Family Medicine

## 2023-12-07 ENCOUNTER — Encounter: Payer: Self-pay | Admitting: Family Medicine

## 2023-12-08 ENCOUNTER — Other Ambulatory Visit (HOSPITAL_COMMUNITY): Payer: Self-pay

## 2023-12-15 ENCOUNTER — Other Ambulatory Visit: Payer: Self-pay

## 2023-12-15 DIAGNOSIS — G8929 Other chronic pain: Secondary | ICD-10-CM

## 2023-12-15 DIAGNOSIS — I739 Peripheral vascular disease, unspecified: Secondary | ICD-10-CM

## 2023-12-15 DIAGNOSIS — E7800 Pure hypercholesterolemia, unspecified: Secondary | ICD-10-CM

## 2023-12-15 MED ORDER — ROSUVASTATIN CALCIUM 10 MG PO TABS: 10.0000 mg | ORAL_TABLET | ORAL | 3 refills | Status: DC

## 2023-12-15 MED ORDER — HYDROCODONE-ACETAMINOPHEN 7.5-325 MG PO TABS: 1.0000 | ORAL_TABLET | Freq: Three times a day (TID) | ORAL | 0 refills | Status: DC | PRN

## 2023-12-24 ENCOUNTER — Other Ambulatory Visit (HOSPITAL_COMMUNITY): Payer: Self-pay

## 2023-12-24 ENCOUNTER — Other Ambulatory Visit: Payer: Self-pay

## 2023-12-24 DIAGNOSIS — G8929 Other chronic pain: Secondary | ICD-10-CM

## 2023-12-25 ENCOUNTER — Other Ambulatory Visit: Payer: Self-pay | Admitting: Family Medicine

## 2023-12-25 DIAGNOSIS — I10 Essential (primary) hypertension: Secondary | ICD-10-CM

## 2023-12-25 MED ORDER — GABAPENTIN 100 MG PO CAPS: 100.0000 mg | ORAL_CAPSULE | Freq: Three times a day (TID) | ORAL | 0 refills | Status: DC

## 2023-12-26 ENCOUNTER — Telehealth: Payer: Self-pay

## 2023-12-26 NOTE — Telephone Encounter (Signed)
 Pharmacy Patient Advocate Encounter   Received notification from CoverMyMeds that prior authorization for CYCLOBENZAPRINE  is required/requested.   Insurance verification completed.   The patient is insured through [facility] ADVANTAGE/RX ADVANCE .  PA required; PA submitted to above mentioned insurance via CoverMyMeds Key/confirmation #/[id number]. Status is pending

## 2023-12-26 NOTE — Telephone Encounter (Signed)
 Pharmacy Patient Advocate Encounter  Received notification from [employer] ADVANTAGE/RX ADVANCE that Prior Authorization for CYCLOBENZAPRINE  has been APPROVED from 12/26/23 to 01/23/24   PA #/Case ID/Reference #: [id number]

## 2024-01-12 ENCOUNTER — Other Ambulatory Visit: Payer: Self-pay | Admitting: Family Medicine

## 2024-01-13 ENCOUNTER — Other Ambulatory Visit: Payer: Self-pay

## 2024-01-13 DIAGNOSIS — G8929 Other chronic pain: Secondary | ICD-10-CM

## 2024-01-14 MED ORDER — HYDROCODONE-ACETAMINOPHEN 7.5-325 MG PO TABS: 1.0000 | ORAL_TABLET | Freq: Three times a day (TID) | ORAL | 0 refills | Status: DC | PRN

## 2024-01-20 ENCOUNTER — Other Ambulatory Visit: Payer: Self-pay | Admitting: Family Medicine

## 2024-01-20 DIAGNOSIS — G8929 Other chronic pain: Secondary | ICD-10-CM

## 2024-01-21 ENCOUNTER — Other Ambulatory Visit: Payer: Self-pay | Admitting: Family Medicine

## 2024-01-21 DIAGNOSIS — I10 Essential (primary) hypertension: Secondary | ICD-10-CM

## 2024-02-11 ENCOUNTER — Other Ambulatory Visit: Payer: Self-pay

## 2024-02-11 DIAGNOSIS — G8929 Other chronic pain: Secondary | ICD-10-CM

## 2024-02-11 MED ORDER — HYDROCODONE-ACETAMINOPHEN 7.5-325 MG PO TABS: 1.0000 | ORAL_TABLET | Freq: Three times a day (TID) | ORAL | 0 refills | Status: DC | PRN

## 2024-02-12 ENCOUNTER — Ambulatory Visit: Payer: PPO

## 2024-02-12 VITALS — Ht 66.0 in | Wt 190.0 lb

## 2024-02-12 DIAGNOSIS — Z0000 Encounter for general adult medical examination without abnormal findings: Secondary | ICD-10-CM | POA: Diagnosis not present

## 2024-02-12 NOTE — Patient Instructions (Addendum)
 Barbara Miles , Thank you for taking time to come for your Medicare Wellness Visit. I appreciate your ongoing commitment to your health goals. Please review the following plan we discussed and let me know if I can assist you in the future.   Referrals/Orders/Follow-Ups/Clinician Recommendations: Yes; Keep maintaining your health by keeping your appointments with Dr. Ellwood Dense and any specialists that you may see.  Call us if you need anything.  Have a great year!!!!  This is a list of the screening recommended for you and due dates:  Health Maintenance  Topic Date Due   DEXA scan (bone density measurement)  Never done   Mammogram  11/28/2018   DTaP/Tdap/Td vaccine (3 - Td or Tdap) 12/26/2021   COVID-19 Vaccine (3 - 2024-25 season) 07/20/2023   Zoster (Shingles) Vaccine (2 of 2) 10/07/2023   Colon Cancer Screening  02/08/2024   Medicare Annual Wellness Visit  02/11/2025   Pneumonia Vaccine  Completed   Flu Shot  Completed   Hepatitis C Screening  Completed   HPV Vaccine  Aged Out    Advanced directives: (Declined) Advance directive discussed with you today. Even though you declined this today, please call our office should you change your mind, and we can give you the proper paperwork for you to fill out.  Next Medicare Annual Wellness Visit scheduled for next year: Yes
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
## 2024-02-23 ENCOUNTER — Other Ambulatory Visit: Payer: Self-pay

## 2024-02-23 DIAGNOSIS — G8929 Other chronic pain: Secondary | ICD-10-CM

## 2024-02-23 DIAGNOSIS — I10 Essential (primary) hypertension: Secondary | ICD-10-CM

## 2024-02-24 ENCOUNTER — Other Ambulatory Visit: Payer: Self-pay

## 2024-02-24 DIAGNOSIS — I10 Essential (primary) hypertension: Secondary | ICD-10-CM

## 2024-02-24 DIAGNOSIS — G8929 Other chronic pain: Secondary | ICD-10-CM

## 2024-02-24 NOTE — Telephone Encounter (Signed)
 Called patient per Dr. Madelaine Etienne request and informed her about the information that Dr. Linwood Dibbles provided about her medications.  Patient stated that she will call office back and schedule an appointment once she speaks with her daughter on a day when her daughter is able to take her to appointment.  If patient calls back, please schedule patient for an Office Visit for medication refills.  Drusilla Kanner, CMA

## 2024-02-25 MED ORDER — LISINOPRIL 20 MG PO TABS: 20.0000 mg | ORAL_TABLET | Freq: Every day | ORAL | 0 refills | Status: DC

## 2024-02-25 MED ORDER — GABAPENTIN 100 MG PO CAPS: 100.0000 mg | ORAL_CAPSULE | Freq: Three times a day (TID) | ORAL | 0 refills | Status: DC

## 2024-02-25 NOTE — Telephone Encounter (Signed)
 1 week supply called in until can make appointment.

## 2024-02-25 NOTE — Telephone Encounter (Signed)
 Called and spoke to patient and she states that she is having transportation issues.  She will need to make an appointment for both she and her husband after she talks to her daughter to confirm that she can bring them.  She only has 1 day of medication left and wants to know if a RX can be called in for her?  She promises to make an appointment for an office visit.  Glennie Hawk, CMA

## 2024-02-25 NOTE — Addendum Note (Signed)
 Addended by: Caro Laroche on: 02/25/2024 04:54 PM   Modules accepted: Orders

## 2024-02-27 ENCOUNTER — Telehealth: Payer: Self-pay

## 2024-02-27 NOTE — Telephone Encounter (Signed)
 Pharmacy Patient Advocate Encounter  Received notification from [facility] ADVANTAGE/RX ADVANCE that Prior Authorization for CYCLOBENZAPRINE has been DENIED.  Full denial letter will be uploaded to the media tab. See denial reason below.    Dx: fibromyalgia  PA #/Case ID/Reference #: [id number]

## 2024-02-27 NOTE — Telephone Encounter (Signed)
 Pharmacy Patient Advocate Encounter   Received notification from CoverMyMeds that prior authorization for CYCLOBENZAPRINE is required/requested.   Insurance verification completed.   The patient is insured through [facility] ADVANTAGE/RX ADVANCE .   PA required; PA submitted to above mentioned insurance via CoverMyMeds Key/confirmation #/EOC [employer]. Status is pending

## 2024-03-02 ENCOUNTER — Ambulatory Visit (INDEPENDENT_AMBULATORY_CARE_PROVIDER_SITE_OTHER): Admitting: Family Medicine

## 2024-03-02 ENCOUNTER — Encounter: Payer: Self-pay | Admitting: Family Medicine

## 2024-03-02 VITALS — BP 108/53 | HR 70 | Wt 189.8 lb

## 2024-03-02 DIAGNOSIS — I10 Essential (primary) hypertension: Secondary | ICD-10-CM

## 2024-03-02 DIAGNOSIS — I739 Peripheral vascular disease, unspecified: Secondary | ICD-10-CM

## 2024-03-02 DIAGNOSIS — M797 Fibromyalgia: Secondary | ICD-10-CM | POA: Diagnosis not present

## 2024-03-02 DIAGNOSIS — R4589 Other symptoms and signs involving emotional state: Secondary | ICD-10-CM | POA: Diagnosis not present

## 2024-03-02 DIAGNOSIS — F411 Generalized anxiety disorder: Secondary | ICD-10-CM | POA: Diagnosis not present

## 2024-03-02 DIAGNOSIS — G8929 Other chronic pain: Secondary | ICD-10-CM | POA: Diagnosis not present

## 2024-03-02 DIAGNOSIS — N1831 Chronic kidney disease, stage 3a: Secondary | ICD-10-CM | POA: Diagnosis not present

## 2024-03-02 MED ORDER — LISINOPRIL 10 MG PO TABS: 10.0000 mg | ORAL_TABLET | Freq: Every day | ORAL | 0 refills | Status: DC

## 2024-03-02 MED ORDER — CYCLOBENZAPRINE HCL 5 MG PO TABS: 5.0000 mg | ORAL_TABLET | Freq: Every evening | ORAL | 1 refills | Status: DC | PRN

## 2024-03-02 MED ORDER — SERTRALINE HCL 100 MG PO TABS: 200.0000 mg | ORAL_TABLET | Freq: Every day | ORAL | 1 refills | Status: DC

## 2024-03-02 NOTE — Patient Instructions (Signed)
 It was great to see you!  Our plans for today:  - We are decreasing your lisinopril to 10mg . Monitor your blood pressure at home and keep a log of your readings. Make sure to be seated for at least 5 minutes prior to testing and not in pain or worked up for the most accurate readings. Bring this log with you to follow up.  - We are decreasing your cyclobenzaprine to 5mg . Take this only as needed for muscle spasms - We are increasing your sertraline to 200mg .  - Come back in 1 month.  We are checking some labs today, we will release these results to your MyChart.  Take care and seek immediate care sooner if you develop any concerns.   Dr. Adabella Stanis
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
## 2024-03-03 ENCOUNTER — Encounter: Payer: Self-pay | Admitting: Family Medicine

## 2024-03-03 LAB — BASIC METABOLIC PANEL WITH GFR
BUN/Creatinine Ratio: 41 — ABNORMAL HIGH (ref 12–28)
BUN: 49 mg/dL — ABNORMAL HIGH (ref 8–27)
CO2: 22 mmol/L (ref 20–29)
Calcium: 9.7 mg/dL (ref 8.7–10.3)
Chloride: 102 mmol/L (ref 96–106)
Creatinine, Ser: 1.19 mg/dL — ABNORMAL HIGH (ref 0.57–1.00)
Glucose: 88 mg/dL (ref 70–99)
Potassium: 5 mmol/L (ref 3.5–5.2)
Sodium: 140 mmol/L (ref 134–144)
eGFR: 49 mL/min/{1.73_m2} — ABNORMAL LOW (ref >59)

## 2024-03-03 NOTE — Assessment & Plan Note (Signed)
 Uncontrolled. Increase sertraline. Consider addition of buspirone if remains uncontrolled at followup. Plan for GAD at f/u.

## 2024-03-03 NOTE — Assessment & Plan Note (Signed)
 Continues to be low, without orthostatic symptoms. Decrease lisinopril.
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
## 2024-03-03 NOTE — Assessment & Plan Note (Signed)
 Not many muscle spasms at bedtime per patient, reduce flexeril with aim to taper off.

## 2024-03-05 ENCOUNTER — Telehealth: Payer: Self-pay | Admitting: Family Medicine

## 2024-03-05 ENCOUNTER — Other Ambulatory Visit: Payer: Self-pay | Admitting: Family Medicine

## 2024-03-05 ENCOUNTER — Telehealth: Payer: Self-pay

## 2024-03-05 DIAGNOSIS — G8929 Other chronic pain: Secondary | ICD-10-CM

## 2024-03-05 NOTE — Telephone Encounter (Signed)
 Patient's daughter Gabriel John calls regarding recent refill of gabapentin  for 7 days.  Confused about reasoning for 7-day course.  I am unable to see anything regarding the gabapentin  course in her chart.  Patient is chronic pain patient.  Recommended calling on Monday so that may forward this concern to Dr. Rumball.  I will forward this phone call as well.

## 2024-03-05 NOTE — Telephone Encounter (Signed)
 Pharmacy Patient Advocate Encounter   Received notification from CoverMyMeds that prior authorization for CYCLOBENZAPRINE  5MG  is required/requested.   Insurance verification completed.   The patient is insured through [facility] ADVANTAGE/RX ADVANCE .   PA required; PA submitted to above mentioned insurance via CoverMyMeds Key/confirmation #/EOC [id number]. Status is pending

## 2024-03-08 MED ORDER — GABAPENTIN 100 MG PO CAPS: 100.0000 mg | ORAL_CAPSULE | Freq: Three times a day (TID) | ORAL | 1 refills | Status: DC

## 2024-03-08 NOTE — Addendum Note (Signed)
 Addended by: Kandis Ormond on: 03/08/2024 08:43 AM   Modules accepted: Orders

## 2024-03-08 NOTE — Telephone Encounter (Signed)
 Pharmacy Patient Advocate Encounter  Received notification from [facility] ADVANT[employer]/RX ADVANCE that Prior Authorization for CYCLOBENZAPRINE  has been APPROVED from 03/05/24 to 04/02/24

## 2024-03-15 ENCOUNTER — Other Ambulatory Visit: Payer: Self-pay

## 2024-03-15 DIAGNOSIS — G8929 Other chronic pain: Secondary | ICD-10-CM

## 2024-03-15 MED ORDER — HYDROCODONE-ACETAMINOPHEN 7.5-325 MG PO TABS
1.0000 | ORAL_TABLET | Freq: Three times a day (TID) | ORAL | 0 refills | Status: DC | PRN
Start: 2024-03-15 — End: 2024-04-14

## 2024-03-24 ENCOUNTER — Other Ambulatory Visit: Payer: Self-pay | Admitting: Family Medicine

## 2024-03-24 DIAGNOSIS — I10 Essential (primary) hypertension: Secondary | ICD-10-CM

## 2024-03-24 NOTE — Telephone Encounter (Signed)
 Dose change.

## 2024-04-14 ENCOUNTER — Other Ambulatory Visit: Payer: Self-pay

## 2024-04-14 DIAGNOSIS — G8929 Other chronic pain: Secondary | ICD-10-CM

## 2024-04-14 MED ORDER — HYDROCODONE-ACETAMINOPHEN 7.5-325 MG PO TABS
1.0000 | ORAL_TABLET | Freq: Three times a day (TID) | ORAL | 0 refills | Status: DC | PRN
Start: 2024-04-14 — End: 2024-05-17

## 2024-04-23 ENCOUNTER — Telehealth: Payer: Self-pay

## 2024-04-23 NOTE — Telephone Encounter (Signed)
 Pharmacy Patient Advocate Encounter   Received notification from CoverMyMeds that prior authorization for Cyclobenzaprine  is required/requested.   Insurance verification completed.   The patient is insured through [facility] ADVANTAGE/RX ADVANCE .   PA required; PA submitted to above mentioned insurance via CoverMyMeds Key/confirmation #/EOC [id number]. Status is pending

## 2024-04-26 NOTE — Telephone Encounter (Signed)
 Pharmacy Patient Advocate Encounter  Received notification from [employer] ADVANT[employer]/RX ADVANCE that Prior Authorization for CYCLOBENZAPRINE  has been APPROVED from 04/23/24 to 05/21/24   PA #/Case ID/Reference #: [id number]

## 2024-04-29 NOTE — Progress Notes (Signed)
   SUBJECTIVE:   CHIEF COMPLAINT / HPI:   Hypertension, PVD, HLD: - Medications: lisinopril , crestor , ASA. Reduced lisinopril  at last visit due to orthostatic symptoms and lower BP. - Compliance: good, has pill pack - Checking BP at home: yes though not consistently, readings normal - no orthostatic symptoms recently.    Fibromyalgia, leg pain - gabapentin , flexeril , norco. Taking norco TID. Feels pain medication is able to keep her walking.    Anxiety - Medications: zoloft  (increased at last visit) - Taking: good compliance, has pill pack - Counseling: no, not interested - Symptoms: hasn't driven in 4 years. Scared, nervous. - Current stressors: none identified. Got worse with COVID - used to enjoy painting butterflies and thinks getting back to doing that will help her mood.   HM - declines mammogram, colon cancer screening, DEXA scan. Due for 2nd dose of shingles, tdap.   OBJECTIVE:   BP 132/60   Pulse 82   Ht 5' 6 (1.676 m)   Wt 192 lb 6.4 oz (87.3 kg)   SpO2 97%   BMI 31.05 kg/m   Gen: well appearing, in NAD Card: RRR Lungs: CTAB Ext: WWP, no edema   ASSESSMENT/PLAN:   HYPERTENSION, BENIGN SYSTEMIC Improved, now to goal. No changes. Obtain BMP today  Generalized anxiety disorder Uncontrolled. Additionally elevated PHQ today. Add buspar. Continue sertraline . Declines therapy. 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 F/u 3 months.   Fibromyalgia Not many muscle spasms at bedtime per patient, reduce flexeril  with aim to taper off. Message sent to pharmacy.  Encounter for chronic pain management PDMP reviewed with appropriate use. Continue current therapy,   F/u 3 months for GAD.  Kandis Ormond, DO

## 2024-04-30 ENCOUNTER — Ambulatory Visit (INDEPENDENT_AMBULATORY_CARE_PROVIDER_SITE_OTHER): Admitting: Family Medicine

## 2024-04-30 ENCOUNTER — Encounter: Payer: Self-pay | Admitting: Family Medicine

## 2024-04-30 VITALS — BP 132/60 | HR 82 | Ht 66.0 in | Wt 192.4 lb

## 2024-04-30 DIAGNOSIS — G8929 Other chronic pain: Secondary | ICD-10-CM

## 2024-04-30 DIAGNOSIS — M797 Fibromyalgia: Secondary | ICD-10-CM

## 2024-04-30 DIAGNOSIS — N1831 Chronic kidney disease, stage 3a: Secondary | ICD-10-CM

## 2024-04-30 DIAGNOSIS — I10 Essential (primary) hypertension: Secondary | ICD-10-CM

## 2024-04-30 DIAGNOSIS — F411 Generalized anxiety disorder: Secondary | ICD-10-CM

## 2024-04-30 MED ORDER — BUSPIRONE HCL 5 MG PO TABS: 5.0000 mg | ORAL_TABLET | Freq: Three times a day (TID) | ORAL | 0 refills | Status: DC

## 2024-04-30 MED ORDER — CYCLOBENZAPRINE HCL 5 MG PO TABS: 5.0000 mg | ORAL_TABLET | Freq: Every evening | ORAL | 1 refills | Status: DC | PRN

## 2024-04-30 MED ORDER — LISINOPRIL 10 MG PO TABS: 10.0000 mg | ORAL_TABLET | Freq: Every day | ORAL | 1 refills | Status: DC

## 2024-04-30 NOTE — Patient Instructions (Signed)
 It was great to see you!  Our plans for today:  - Take the buspirone three times daily for your anxiety. Try to get back to painting butterflies.  - We will clarify with the pharmacy about your cyclobenzaprine  dosing. Only take this as needed if you are having muscle cramps.  - No other changes to your medications - come back in 3 months for follow up  We are checking some labs today, we will release these results to your MyChart.  Take care and seek immediate care sooner if you develop any concerns.   Dr. Raymund Manrique

## 2024-04-30 NOTE — Assessment & Plan Note (Signed)
 Not many muscle spasms at bedtime per patient, reduce flexeril  with aim to taper off. Message sent to pharmacy.

## 2024-04-30 NOTE — Assessment & Plan Note (Addendum)
 Uncontrolled. Additionally elevated PHQ today. Add buspar. Continue sertraline . Declines therapy. 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 F/u 3 months.

## 2024-04-30 NOTE — Assessment & Plan Note (Signed)
 PDMP reviewed with appropriate use. Continue current therapy,

## 2024-04-30 NOTE — Assessment & Plan Note (Signed)
 Improved, now to goal. No changes. Obtain BMP today

## 2024-05-03 ENCOUNTER — Telehealth: Payer: Self-pay

## 2024-05-03 NOTE — Telephone Encounter (Signed)
 Called patient per Dr. Jorden Nevin request to set a lab appointment to get blood work redrawn.  Was able to set appointment for 05/06/2024 at 11:30am.  Christ Courier, CMA

## 2024-05-03 NOTE — Addendum Note (Signed)
 Addended by: Kandis Ormond on: 05/03/2024 12:20 PM   Modules accepted: Orders

## 2024-05-03 NOTE — Telephone Encounter (Signed)
-----   Message from Kandis Ormond sent at 05/03/2024 12:18 PM EDT ----- Hi team,  Can we let patient know her labs did not result on Friday due to lab error (see Robert's email). Can we help her make a lab-only appointment to get these redrawn?  Thanks!!

## 2024-05-05 LAB — BASIC METABOLIC PANEL WITH GFR

## 2024-05-06 ENCOUNTER — Other Ambulatory Visit

## 2024-05-06 DIAGNOSIS — I10 Essential (primary) hypertension: Secondary | ICD-10-CM

## 2024-05-07 ENCOUNTER — Ambulatory Visit: Payer: Self-pay | Admitting: Family Medicine

## 2024-05-07 LAB — BASIC METABOLIC PANEL WITH GFR
BUN/Creatinine Ratio: 30 — ABNORMAL HIGH (ref 12–28)
BUN: 34 mg/dL — ABNORMAL HIGH (ref 8–27)
CO2: 24 mmol/L (ref 20–29)
Calcium: 9.8 mg/dL (ref 8.7–10.3)
Chloride: 100 mmol/L (ref 96–106)
Creatinine, Ser: 1.13 mg/dL — ABNORMAL HIGH (ref 0.57–1.00)
Glucose: 105 mg/dL — ABNORMAL HIGH (ref 70–99)
Potassium: 4.6 mmol/L (ref 3.5–5.2)
Sodium: 141 mmol/L (ref 134–144)
eGFR: 52 mL/min/{1.73_m2} — ABNORMAL LOW (ref >59)

## 2024-05-15 ENCOUNTER — Other Ambulatory Visit: Payer: Self-pay | Admitting: Family Medicine

## 2024-05-15 DIAGNOSIS — G8929 Other chronic pain: Secondary | ICD-10-CM

## 2024-05-17 NOTE — Telephone Encounter (Signed)
 Patient returns call to nurse line regarding prescription.   She states that she has 1 tablet left. She apologizes for late refill request.   Advised that I would forward message to PCP.   Chiquita JAYSON English, RN

## 2024-06-14 ENCOUNTER — Other Ambulatory Visit: Payer: Self-pay

## 2024-06-14 DIAGNOSIS — G8929 Other chronic pain: Secondary | ICD-10-CM

## 2024-06-14 MED ORDER — HYDROCODONE-ACETAMINOPHEN 7.5-325 MG PO TABS: 1.0000 | ORAL_TABLET | Freq: Three times a day (TID) | ORAL | 0 refills | Status: DC | PRN

## 2024-06-21 ENCOUNTER — Other Ambulatory Visit: Payer: Self-pay | Admitting: Family Medicine

## 2024-07-11 ENCOUNTER — Other Ambulatory Visit: Payer: Self-pay | Admitting: Family Medicine

## 2024-07-12 ENCOUNTER — Other Ambulatory Visit: Payer: Self-pay

## 2024-07-12 DIAGNOSIS — G8929 Other chronic pain: Secondary | ICD-10-CM

## 2024-07-12 MED ORDER — HYDROCODONE-ACETAMINOPHEN 7.5-325 MG PO TABS: 1.0000 | ORAL_TABLET | Freq: Three times a day (TID) | ORAL | 0 refills | Status: DC | PRN

## 2024-07-23 ENCOUNTER — Other Ambulatory Visit: Payer: Self-pay | Admitting: Family Medicine

## 2024-07-26 ENCOUNTER — Encounter: Payer: Self-pay | Admitting: Family Medicine

## 2024-07-26 ENCOUNTER — Ambulatory Visit: Admitting: Family Medicine

## 2024-07-26 VITALS — BP 132/75 | HR 85 | Temp 98.6°F | Ht 65.0 in | Wt 184.8 lb

## 2024-07-26 DIAGNOSIS — Z23 Encounter for immunization: Secondary | ICD-10-CM

## 2024-07-26 DIAGNOSIS — F411 Generalized anxiety disorder: Secondary | ICD-10-CM | POA: Diagnosis not present

## 2024-07-26 DIAGNOSIS — I10 Essential (primary) hypertension: Secondary | ICD-10-CM

## 2024-07-26 MED ORDER — TRIAMCINOLONE ACETONIDE 0.5 % EX CREA: TOPICAL_CREAM | CUTANEOUS | 2 refills | Status: AC

## 2024-07-26 MED ORDER — BUSPIRONE HCL 10 MG PO TABS: 10.0000 mg | ORAL_TABLET | Freq: Three times a day (TID) | ORAL | 1 refills | Status: AC

## 2024-07-26 NOTE — Progress Notes (Signed)
   SUBJECTIVE:   CHIEF COMPLAINT / HPI:    Hypertension, PVD, HLD: - Medications: lisinopril , crestor , ASA. Reduced lisinopril  at last visit due to orthostatic symptoms and lower BP. - Compliance: good, has pill pack - Checking BP at home: no - no orthostatic symptoms    Anxiety - Medications: zoloft  (increased at last visit), buspar  - Taking: good compliance, has pill pack - Counseling: no, now interested - Symptoms: hasn't driven in 4 years. Scared, nervous. - Current stressors: husband's health. Feels anxiety has worsened the last few weeks, unknown why. Social anxiety since COVID - used to enjoy painting butterflies and thinks getting back to doing that will help her mood. Planning to get back to this, cleaning out painting room.      OBJECTIVE:   BP 132/75   Pulse 85   Temp 98.6 F (37 C)   Ht 5' 5 (1.651 m)   Wt 184 lb 12.8 oz (83.8 kg)   SpO2 100%   BMI 30.75 kg/m   Gen: well appearing, in NAD Card: Reg rate Lungs: Comfortable WOB on RA Ext: WWP, no edema Psych: pleasant mood and affect. Fidgety.   ASSESSMENT/PLAN:   HYPERTENSION, BENIGN SYSTEMIC At goal, no changes.   Generalized anxiety disorder Slightly improved but remains uncontrolled. Discussed counseling extensively today, handout of therapists provided. Increase buspar , continue zoloft . 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 F/u 3 months, virtual ok.     Donald CHRISTELLA Lai, DO

## 2024-07-26 NOTE — Assessment & Plan Note (Signed)
 At goal, no changes

## 2024-07-26 NOTE — Assessment & Plan Note (Signed)
 Slightly improved but remains uncontrolled. Discussed counseling extensively today, handout of therapists provided. Increase buspar , continue zoloft . 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 F/u 3 months, virtual ok.

## 2024-07-26 NOTE — Patient Instructions (Signed)
 It was great to see you!  Our plans for today:  - For information on therapists, please go to [email or web address]. Look over the printout of therapists I gave you today. You can also contact your insurance company to find an Hospital doctor.  - We are increasing your buspirone  with your next pill pack.  - We will plan to check in in a few months.   Take care and seek immediate care sooner if you develop any concerns.   Dr. Kana Reimann

## 2024-08-11 ENCOUNTER — Other Ambulatory Visit: Payer: Self-pay | Admitting: Family Medicine

## 2024-08-11 DIAGNOSIS — R4589 Other symptoms and signs involving emotional state: Secondary | ICD-10-CM

## 2024-08-11 DIAGNOSIS — G8929 Other chronic pain: Secondary | ICD-10-CM

## 2024-08-12 ENCOUNTER — Other Ambulatory Visit: Payer: Self-pay | Admitting: Family Medicine

## 2024-08-12 DIAGNOSIS — G8929 Other chronic pain: Secondary | ICD-10-CM

## 2024-09-09 ENCOUNTER — Other Ambulatory Visit: Payer: Self-pay | Admitting: Family Medicine

## 2024-09-14 ENCOUNTER — Other Ambulatory Visit: Payer: Self-pay | Admitting: Family Medicine

## 2024-09-14 DIAGNOSIS — G8929 Other chronic pain: Secondary | ICD-10-CM

## 2024-10-12 ENCOUNTER — Other Ambulatory Visit: Payer: Self-pay | Admitting: Family Medicine

## 2024-10-12 DIAGNOSIS — G8929 Other chronic pain: Secondary | ICD-10-CM

## 2024-11-09 ENCOUNTER — Other Ambulatory Visit: Payer: Self-pay | Admitting: Family Medicine

## 2024-11-09 DIAGNOSIS — I10 Essential (primary) hypertension: Secondary | ICD-10-CM

## 2024-11-16 ENCOUNTER — Other Ambulatory Visit: Payer: Self-pay

## 2024-11-16 DIAGNOSIS — G8929 Other chronic pain: Secondary | ICD-10-CM

## 2024-11-16 MED ORDER — HYDROCODONE-ACETAMINOPHEN 7.5-325 MG PO TABS: 1.0000 | ORAL_TABLET | Freq: Three times a day (TID) | ORAL | 0 refills | Status: DC | PRN

## 2024-12-07 ENCOUNTER — Other Ambulatory Visit: Payer: Self-pay

## 2024-12-07 DIAGNOSIS — I739 Peripheral vascular disease, unspecified: Secondary | ICD-10-CM

## 2024-12-07 DIAGNOSIS — E7800 Pure hypercholesterolemia, unspecified: Secondary | ICD-10-CM

## 2024-12-08 MED ORDER — ROSUVASTATIN CALCIUM 10 MG PO TABS: 10.0000 mg | ORAL_TABLET | ORAL | 3 refills | Status: AC

## 2024-12-16 ENCOUNTER — Other Ambulatory Visit: Payer: Self-pay

## 2024-12-16 DIAGNOSIS — G8929 Other chronic pain: Secondary | ICD-10-CM

## 2024-12-16 MED ORDER — HYDROCODONE-ACETAMINOPHEN 7.5-325 MG PO TABS: 1.0000 | ORAL_TABLET | Freq: Three times a day (TID) | ORAL | 0 refills | Status: AC | PRN

## 2025-02-14 ENCOUNTER — Encounter
